# Patient Record
Sex: Female | Born: 1965 | Race: White | Hispanic: No | Marital: Married | Smoking: Never smoker
Health system: Southern US, Community
[De-identification: ages and names within clinical notes are randomized; demographics above are authoritative.]

## PROBLEM LIST (undated history)

## (undated) DIAGNOSIS — Z1371 Encounter for nonprocreative screening for genetic disease carrier status: Secondary | ICD-10-CM

## (undated) DIAGNOSIS — K219 Gastro-esophageal reflux disease without esophagitis: Secondary | ICD-10-CM

## (undated) DIAGNOSIS — G43909 Migraine, unspecified, not intractable, without status migrainosus: Secondary | ICD-10-CM

## (undated) DIAGNOSIS — N644 Mastodynia: Secondary | ICD-10-CM

## (undated) DIAGNOSIS — O009 Unspecified ectopic pregnancy without intrauterine pregnancy: Secondary | ICD-10-CM

## (undated) DIAGNOSIS — Z803 Family history of malignant neoplasm of breast: Secondary | ICD-10-CM

## (undated) DIAGNOSIS — M5030 Other cervical disc degeneration, unspecified cervical region: Secondary | ICD-10-CM

## (undated) DIAGNOSIS — N921 Excessive and frequent menstruation with irregular cycle: Secondary | ICD-10-CM

## (undated) HISTORY — DX: Gastro-esophageal reflux disease without esophagitis: K21.9

## (undated) HISTORY — DX: Unspecified ectopic pregnancy without intrauterine pregnancy: O00.90

## (undated) HISTORY — DX: Excessive and frequent menstruation with irregular cycle: N92.1

## (undated) HISTORY — DX: Mastodynia: N64.4

## (undated) HISTORY — DX: Migraine, unspecified, not intractable, without status migrainosus: G43.909

## (undated) HISTORY — DX: Encounter for nonprocreative screening for genetic disease carrier status: Z13.71

## (undated) HISTORY — DX: Other cervical disc degeneration, unspecified cervical region: M50.30

## (undated) HISTORY — DX: Family history of malignant neoplasm of breast: Z80.3

---

## 1994-12-06 HISTORY — PX: LEEP: SHX91

## 2006-12-06 HISTORY — PX: OTHER SURGICAL HISTORY: SHX169

## 2009-02-23 ENCOUNTER — Ambulatory Visit: Payer: Self-pay | Admitting: Internal Medicine

## 2012-08-31 HISTORY — PX: ENDOMETRIAL ABLATION: SHX621

## 2016-05-28 ENCOUNTER — Other Ambulatory Visit: Payer: Self-pay | Admitting: Obstetrics & Gynecology

## 2016-05-28 DIAGNOSIS — Z1231 Encounter for screening mammogram for malignant neoplasm of breast: Secondary | ICD-10-CM

## 2016-07-12 ENCOUNTER — Ambulatory Visit
Admission: RE | Admit: 2016-07-12 | Discharge: 2016-07-12 | Disposition: A | Discharge status: Home / Self Care | Payer: BC Managed Care – PPO | Source: Ambulatory Visit | Attending: Obstetrics & Gynecology | Admitting: Obstetrics & Gynecology

## 2016-07-12 ENCOUNTER — Other Ambulatory Visit: Payer: Self-pay | Admitting: Obstetrics & Gynecology

## 2016-07-12 DIAGNOSIS — Z1231 Encounter for screening mammogram for malignant neoplasm of breast: Secondary | ICD-10-CM

## 2016-09-05 DIAGNOSIS — Z1371 Encounter for nonprocreative screening for genetic disease carrier status: Secondary | ICD-10-CM

## 2016-09-05 DIAGNOSIS — Z9189 Other specified personal risk factors, not elsewhere classified: Secondary | ICD-10-CM

## 2016-09-05 HISTORY — DX: Other specified personal risk factors, not elsewhere classified: Z91.89

## 2016-09-05 HISTORY — DX: Encounter for nonprocreative screening for genetic disease carrier status: Z13.71

## 2017-01-05 ENCOUNTER — Other Ambulatory Visit: Payer: Self-pay | Admitting: Neurology

## 2017-01-05 DIAGNOSIS — G43711 Chronic migraine without aura, intractable, with status migrainosus: Secondary | ICD-10-CM

## 2017-01-12 DIAGNOSIS — G43711 Chronic migraine without aura, intractable, with status migrainosus: Secondary | ICD-10-CM | POA: Insufficient documentation

## 2017-01-13 ENCOUNTER — Ambulatory Visit
Admission: RE | Admit: 2017-01-13 | Discharge: 2017-01-13 | Disposition: A | Discharge status: Home / Self Care | Payer: BC Managed Care – PPO | Source: Ambulatory Visit | Attending: Neurology | Admitting: Neurology

## 2017-01-13 DIAGNOSIS — R202 Paresthesia of skin: Secondary | ICD-10-CM | POA: Diagnosis present

## 2017-01-13 DIAGNOSIS — G43711 Chronic migraine without aura, intractable, with status migrainosus: Secondary | ICD-10-CM | POA: Insufficient documentation

## 2017-01-13 MED ORDER — GADOBENATE DIMEGLUMINE 529 MG/ML IV SOLN
13.0000 mL | Freq: Once | INTRAVENOUS | Status: AC | PRN
  Administered 2017-01-13: 13 mL via INTRAVENOUS

## 2017-02-11 ENCOUNTER — Encounter: Payer: Self-pay | Admitting: Certified Nurse Midwife

## 2017-03-10 ENCOUNTER — Encounter: Payer: BC Managed Care – PPO | Admitting: Certified Nurse Midwife

## 2017-03-10 ENCOUNTER — Encounter: Payer: Self-pay | Admitting: Certified Nurse Midwife

## 2017-03-10 ENCOUNTER — Ambulatory Visit: Payer: BC Managed Care – PPO | Admitting: Certified Nurse Midwife

## 2017-03-10 VITALS — BP 108/68 | HR 80 | Ht 63.0 in | Wt 138.0 lb

## 2017-03-10 DIAGNOSIS — R202 Paresthesia of skin: Secondary | ICD-10-CM | POA: Diagnosis not present

## 2017-03-10 DIAGNOSIS — N951 Menopausal and female climacteric states: Secondary | ICD-10-CM | POA: Diagnosis not present

## 2017-03-10 DIAGNOSIS — Z803 Family history of malignant neoplasm of breast: Secondary | ICD-10-CM | POA: Diagnosis not present

## 2017-03-10 DIAGNOSIS — G43019 Migraine without aura, intractable, without status migrainosus: Secondary | ICD-10-CM | POA: Diagnosis not present

## 2017-03-10 DIAGNOSIS — R002 Palpitations: Secondary | ICD-10-CM

## 2017-07-18 ENCOUNTER — Ambulatory Visit: Payer: BC Managed Care – PPO | Admitting: Obstetrics & Gynecology

## 2017-08-06 ENCOUNTER — Encounter: Payer: Self-pay | Admitting: Certified Nurse Midwife

## 2017-08-06 DIAGNOSIS — Z803 Family history of malignant neoplasm of breast: Secondary | ICD-10-CM | POA: Insufficient documentation

## 2017-08-06 DIAGNOSIS — G43909 Migraine, unspecified, not intractable, without status migrainosus: Secondary | ICD-10-CM | POA: Insufficient documentation

## 2017-08-06 DIAGNOSIS — Z1371 Encounter for nonprocreative screening for genetic disease carrier status: Secondary | ICD-10-CM | POA: Insufficient documentation

## 2017-08-06 DIAGNOSIS — M5030 Other cervical disc degeneration, unspecified cervical region: Secondary | ICD-10-CM | POA: Insufficient documentation

## 2017-08-06 NOTE — Progress Notes (Signed)
Obstetrics & Gynecology Office Visit   Chief Complaint:  Chief Complaint  Patient presents with  . menopausal symptoms  . hormonal problems    History of Present Illness: 51 year old female who presents with several concerns that she thinks may be due to being perimenopausal. Her menses are infrequent and her LMP was 26 Oct. 2017. She has been having almost daily headaches on the crown of her head for the past 2 months.  Had a head MRI 01/2017 which was normal. Nortriptyline and Imitrex did not help. She has had no hot flashes, but some night sweats and some increased anxiety. Other recent symptoms have included tingling in her legs, breast tenderness, some palpitations, and fatigue. Magnesium supplements have helped to reduce palpitations. She is also taking omega 3, and vitamins D3, B12, and B6.   There is a family history of breast cancer in her maternal aunt, maternal cousin and MGM. She had a negative MYRISK test, but her lifetime risk of breast cancer is 23.2%. Her last annual exam was 51 Oct 2017 and her last mammogram 07/16/2017.    Review of Systems:  ROS   Past Medical History:  Past Medical History:  Diagnosis Date  . BRCA negative    MYRISK was negative/ lifetime risk of breast cancer of 23.2%  . Breast tenderness   . Degenerative disc disease, cervical   . Ectopic pregnancy    treated with methotrexate  . Menometrorrhagia   . Migraine     Past Surgical History:  Past Surgical History:  Procedure Laterality Date  . Bilateral tubal ligation  2008  . CESAREAN SECTION  2008   FITL  . ENDOMETRIAL ABLATION  08/31/2012   hysteroscopy/ D&C and Novasure  . LEEP  1996    Gynecologic History: Patient's last menstrual period was 09/26/2016.  Obstetric History:  OB History  Gravida Para Term Preterm AB Living  _0 SAB TAB Ectopic Multiple Live Births  _1 # Outcome Date GA Lbr Len/2nd Weight Sex Delivery Anes PTL Lv  6 Term 03/09/08   6 lb (2.722  kg) M CS-LTranv   LIV     Complications: Fetal Intolerance  5 Term 12/15/00   8 lb 2 oz (3.685 kg) F Vag-Spont   LIV  4 Term 04/10/98   7 lb 6 oz (3.345 kg) M Vag-Spont   LIV  3 Ectopic           2 SAB           1 SAB             Obstetric Comments  SAB in 2001 and 2004    Family History:  Family History  Problem Relation Age of Onset  . Breast cancer Maternal Aunt 89  . Breast cancer Maternal Grandmother   . Breast cancer Cousin        mat cousin (maternal aunt's daughter)  . Osteoporosis Mother   . Endometriosis Mother   . Kidney cancer Father     Social History:  Social History   Social History  . Marital status: Married    Spouse name: N/A  . Number of children: 3  . Years of education: N/A   Occupational History  . Not on file.   Social History Main Topics  . Smoking status: Never Smoker  . Smokeless tobacco: Never Used  . Alcohol use No  . Drug use:  No  . Sexual activity: Yes    Birth control/ protection: None   Other Topics Concern  . Not on file   Social History Narrative  . No narrative on file    Allergies:  No Known Allergies  Medications: Prior to Admission medications   Medication Sig Start Date End Date Taking? Authorizing Provider  Cholecalciferol (VITAMIN D3) 2000 units capsule Take by mouth.    [provider]  fluticasone Asencion Islam) 50 MCG/ACT nasal spray  02/14/17   [provider]  folic acid (FOLVITE) 1 MG tablet Take by mouth.    [provider]  Lactobacillus (PROBIOTIC ACIDOPHILUS) TABS Take by mouth.    [provider]  Magnesium Bisglycinate (MAG GLYCINATE PO) Take by mouth.    [provider]  Omega-3 Fatty Acids (FISH OIL PO) Take by mouth.    [provider]  pyridOXINE (VITAMIN B-6) 25 MG tablet Take by mouth.    [provider]  SUMAtriptan (IMITREX) 100 MG tablet Take by mouth. 01/04/17   [provider]  vitamin B-12 (CYANOCOBALAMIN) 1000 MCG tablet  Take by mouth.    [provider]  vitamin C (ASCORBIC ACID) 500 MG tablet Take by mouth.    [provider]    Physical Exam Vitals: BP 108/68   Pulse 80   Ht _0  (1.6 m)   Wt 138 lb (62.6 kg)   LMP 09/26/2016   BMI 24.45 kg/m  Physical Exam  Constitutional: She is oriented to person, place, and time. She appears well-developed and well-nourished. No distress.  Neurological: She is alert and oriented to person, place, and time.  Skin: Skin is warm and dry. No rash noted.  Psychiatric: She has a normal mood and affect. Her behavior is normal. Judgment and thought content normal.     Assessment/ Plan 51 y.o. [id number] perimenopausal female with some symptoms that are associated with the perimenopause, but others that are not. Discussed use of HRT to treat problems like vasomotor symptoms. DIscussed pros, cons and risks.  She is less likely to use HRT due to her increased risk of breast cancer. DIscussed other OTC herbal treatments for perimenopausal symptoms. She has a follow up appointment with her neurologist today. Recommend she talk to him first. If she desires to try HRT to see if it will relieve some of her symptoms, she is to call me or RTO to discuss further Her next annual is due in October.  Dalia Heading, CNM

## 2017-08-10 NOTE — Progress Notes (Signed)
This encounter was created in error - please disregard.

## 2017-08-18 ENCOUNTER — Encounter: Payer: Self-pay | Admitting: Advanced Practice Midwife

## 2017-08-18 ENCOUNTER — Ambulatory Visit (INDEPENDENT_AMBULATORY_CARE_PROVIDER_SITE_OTHER): Payer: BC Managed Care – PPO | Admitting: Advanced Practice Midwife

## 2017-08-18 ENCOUNTER — Ambulatory Visit: Payer: BC Managed Care – PPO | Admitting: Obstetrics & Gynecology

## 2017-08-18 VITALS — BP 110/70 | HR 66 | Ht 63.0 in | Wt 138.0 lb

## 2017-08-18 DIAGNOSIS — Z0000 Encounter for general adult medical examination without abnormal findings: Secondary | ICD-10-CM

## 2017-08-18 DIAGNOSIS — G609 Hereditary and idiopathic neuropathy, unspecified: Secondary | ICD-10-CM | POA: Insufficient documentation

## 2017-08-18 DIAGNOSIS — M25579 Pain in unspecified ankle and joints of unspecified foot: Secondary | ICD-10-CM | POA: Insufficient documentation

## 2017-08-18 DIAGNOSIS — Z01419 Encounter for gynecological examination (general) (routine) without abnormal findings: Secondary | ICD-10-CM

## 2017-08-18 DIAGNOSIS — Z124 Encounter for screening for malignant neoplasm of cervix: Secondary | ICD-10-CM

## 2017-08-18 LAB — HM PAP SMEAR: HM Pap smear: NORMAL

## 2017-08-18 NOTE — Addendum Note (Signed)
Addended by: Tresea MallGLEDHILL, Darcie Mellone on: 08/18/2017 11:31 AM   Modules accepted: Orders

## 2017-08-18 NOTE — Progress Notes (Signed)
Patient ID: Helen Turner, female   DOB: September 26, 1966, 51 y.o.   MRN: [id number]     Gynecology Annual Exam  PCP: Patient, No Pcp Per  Chief Complaint:  Chief Complaint  Patient presents with  . Gynecologic Exam    needs mammogram sched.    History of Present Illness:Patient is a 51 y.o. [id number] presents for annual exam. The patient has complaints today of hormonal changes. She has had irregular cycles for the past 9 months. She has history of uterine ablation 6 years ago but has had monthly periods that last for 5 days that consist of very light spotting up until last December when she did not have any bleeding until June of this year. Since June she has had a period monthly. Beginning in December she had a headache that lasted for 3 months and lower extremity tingling. She was seen by a neurologist, had thorough workup and found nothing neurologically wrong. It was suggested that her hormonal changes may somehow be responsible for her symptoms. She has some vasomotor symptoms. She denies vaginal dryness. Her main complaint now is acid reflux that she blames on anti inflammatory medications she has been taking. She takes protonix for that.    LMP: Patient's last menstrual period was 07/11/2017. Postcoital Bleeding: occasional Dysmenorrhea: no   The patient is sexually active. She denies dyspareunia.  The patient does perform self breast exams.  There is notable family history of breast or ovarian cancer in her family. Her maternal aunt was diagnosed with breast cancer younger than 54's. The patient has had genetic testing and was found to be BRCA negative.   The patient wears seatbelts: yes.   The patient has regular exercise: yes.  She admits to healthy diet and adequate hydration.  The patient denies current symptoms of depression.     Review of Systems: Review of Systems  Constitutional: Negative.   HENT: Negative.   Eyes: Negative.   Respiratory: Negative.   Cardiovascular:  Negative.   Gastrointestinal: Negative.   Genitourinary: Negative.   Musculoskeletal: Negative.   Skin: Negative.   Neurological: Positive for tingling.       Tingling in lower extremities has improved recently  Endo/Heme/Allergies: Negative.   Psychiatric/Behavioral: Negative.     Past Medical History:  Past Medical History:  Diagnosis Date  . Acid reflux   . BRCA negative    MYRISK was negative/ lifetime risk of breast cancer of 23.2%  . Breast tenderness   . Degenerative disc disease, cervical   . Ectopic pregnancy    treated with methotrexate  . Family history of breast cancer   . Menometrorrhagia   . Migraine     Past Surgical History:  Past Surgical History:  Procedure Laterality Date  . Bilateral tubal ligation  2008  . CESAREAN SECTION  2008   FITL  . ENDOMETRIAL ABLATION  08/31/2012   hysteroscopy/ D&C and Novasure  . LEEP  1996    Gynecologic History:  Patient's last menstrual period was 07/11/2017. Last Pap: 3 years ago Results were:  no abnormalities  Last mammogram: 1 year ago Results were: BI-RAD I Obstetric History: [id number]  Family History:  Family History  Problem Relation Age of Onset  . Breast cancer Maternal Aunt 89  . Breast cancer Maternal Grandmother   . Breast cancer Cousin        mat cousin (maternal aunt's daughter)  . Osteoporosis Mother   . Endometriosis Mother   . Kidney cancer Father  Social History:  Social History   Social History  . Marital status: Married    Spouse name: N/A  . Number of children: 3  . Years of education: N/A   Occupational History  . Not on file.   Social History Main Topics  . Smoking status: Never Smoker  . Smokeless tobacco: Never Used  . Alcohol use No  . Drug use: No  . Sexual activity: Yes    Birth control/ protection: None   Other Topics Concern  . Not on file   Social History Narrative  . No narrative on file    Allergies:  Allergies  Allergen Reactions  . Codeine Nausea  Only  . Amoxicillin-Pot Clavulanate Other (See Comments)    Nausea and causes worsening of symptoms in which she was treated for.   . Doxycycline Other (See Comments)    Nausea and causes worsening of symptoms in which she was treated for.   . Topiramate Other (See Comments)     Symptoms of fogginess    Medications: Prior to Admission medications   Medication Sig Start Date End Date Taking? Authorizing Provider  fluticasone (FLONASE) 50 MCG/ACT nasal spray  02/14/17  Yes [provider]  pantoprazole (PROTONIX) 40 MG tablet Take 40 mg by mouth daily. 08/02/17  Yes [provider]  SUMAtriptan (IMITREX) 100 MG tablet Take by mouth. 01/04/17  Yes [provider]  Cholecalciferol (VITAMIN D3) 2000 units capsule Take by mouth.    [provider]  folic acid (FOLVITE) 1 MG tablet Take by mouth.    [provider]  Lactobacillus (PROBIOTIC ACIDOPHILUS) TABS Take by mouth.    [provider]  Magnesium Bisglycinate (MAG GLYCINATE PO) Take by mouth.    [provider]  Omega-3 Fatty Acids (FISH OIL PO) Take by mouth.    [provider]  pyridOXINE (VITAMIN B-6) 25 MG tablet Take by mouth.    [provider]  vitamin B-12 (CYANOCOBALAMIN) 1000 MCG tablet Take by mouth.    [provider]  vitamin C (ASCORBIC ACID) 500 MG tablet Take by mouth.    [provider]    Physical Exam Vitals: Blood pressure 110/70, pulse 66, height '5\' 3"'  (1.6 m), weight 138 lb (62.6 kg), last menstrual period 07/11/2017.  General: NAD HEENT: normocephalic, anicteric Thyroid: no enlargement, no palpable nodules Pulmonary: No increased work of breathing, CTAB Cardiovascular: RRR, distal pulses 2+ Breast: Breast symmetrical, no tenderness, no palpable nodules or masses, no skin or nipple retraction present, no nipple discharge.  No axillary or supraclavicular lymphadenopathy. Abdomen: NABS, soft, non-tender, non-distended.   Umbilicus without lesions.  No hepatomegaly, splenomegaly or masses palpable. No evidence of hernia  Genitourinary:  External: Normal external female genitalia.  Normal urethral meatus, normal  Bartholin's and Skene's glands.    Vagina: Normal vaginal mucosa, no evidence of prolapse.    Cervix: Grossly normal in appearance, no bleeding, no CMT  Uterus: Non-enlarged, mobile, normal contour.    Adnexa: ovaries non-enlarged, no adnexal masses  Rectal: deferred  Lymphatic: no evidence of inguinal lymphadenopathy Extremities: no edema, erythema, or tenderness Neurologic: Grossly intact Psychiatric: mood appropriate, affect full   Assessment: 51 y.o. [id number] routine annual exam  Plan: Problem List Items Addressed This Visit    None    Visit Diagnoses    Blood tests for routine general physical examination    -  Primary   Relevant Orders   Lipid Panel With LDL/HDL Ratio  1) Mammogram - recommend yearly screening mammogram.  Mammogram Was ordered today  2) Continue healthy lifestyle diet and exercise  3) ASCCP guidelines and rational discussed.  Patient opts for every 3 years screening interval  4) Osteoporosis  - per USPTF routine screening DEXA at age 49  Consider FDA-approved medical therapies in postmenopausal women and men aged 55 years and older, based on the following: a) A hip or vertebral (clinical or morphometric) fracture b) T-score ? -2.5 at the femoral neck or spine after appropriate evaluation to exclude secondary causes C) Low bone mass (T-score between -1.0 and -2.5 at the femoral neck or spine) and a 10-year probability of a hip fracture ? 3% or a 10-year probability of a major osteoporosis-related fracture ? 20% based on the [place]-adapted WHO algorithm   5) Routine healthcare maintenance including cholesterol, diabetes screening discussed Ordered today  6) Colonoscopy ordered by PCP.  Screening recommended starting at age 1 for average risk individuals, age 78  for individuals deemed at increased risk (including African Americans) and recommended to continue until age 68.  For patient age 69-85 individualized approach is recommended.  Gold standard screening is via colonoscopy, Cologuard screening is an acceptable alternative for patient unwilling or unable to undergo colonoscopy.  "Colorectal cancer screening for average?risk adults: 2018 guideline update from the American Cancer Society"CA: A Cancer Journal for Clinicians: May 04, 2017   7) Follow up 1 year for routine annual  Rod Can, [place]

## 2017-08-19 LAB — LIPID PANEL WITH LDL/HDL RATIO
Cholesterol, Total: 188 mg/dL (ref 100–199)
HDL: 66 mg/dL (ref >39)
LDL Calculated: 104 mg/dL — ABNORMAL HIGH (ref 0–99)
LDl/HDL Ratio: 1.6 ratio (ref 0.0–3.2)
TRIGLYCERIDES: 89 mg/dL (ref 0–149)
VLDL Cholesterol Cal: 18 mg/dL (ref 5–40)

## 2017-08-23 LAB — IGP, APTIMA HPV
HPV Aptima: NEGATIVE
PAP SMEAR COMMENT: 0

## 2017-09-19 ENCOUNTER — Ambulatory Visit
Admission: RE | Admit: 2017-09-19 | Discharge: 2017-09-19 | Disposition: A | Discharge status: Home / Self Care | Payer: BC Managed Care – PPO | Source: Ambulatory Visit | Attending: Advanced Practice Midwife | Admitting: Advanced Practice Midwife

## 2017-09-19 DIAGNOSIS — Z1231 Encounter for screening mammogram for malignant neoplasm of breast: Secondary | ICD-10-CM | POA: Insufficient documentation

## 2017-09-19 DIAGNOSIS — N6489 Other specified disorders of breast: Secondary | ICD-10-CM | POA: Insufficient documentation

## 2017-09-19 DIAGNOSIS — R928 Other abnormal and inconclusive findings on diagnostic imaging of breast: Secondary | ICD-10-CM | POA: Insufficient documentation

## 2017-09-19 DIAGNOSIS — Z01419 Encounter for gynecological examination (general) (routine) without abnormal findings: Secondary | ICD-10-CM

## 2017-09-20 ENCOUNTER — Other Ambulatory Visit: Payer: Self-pay | Admitting: Advanced Practice Midwife

## 2017-09-20 DIAGNOSIS — R928 Other abnormal and inconclusive findings on diagnostic imaging of breast: Secondary | ICD-10-CM

## 2017-09-20 DIAGNOSIS — N6489 Other specified disorders of breast: Secondary | ICD-10-CM

## 2017-09-22 ENCOUNTER — Other Ambulatory Visit: Payer: Self-pay | Admitting: Advanced Practice Midwife

## 2017-09-22 DIAGNOSIS — N6489 Other specified disorders of breast: Secondary | ICD-10-CM

## 2017-09-26 ENCOUNTER — Ambulatory Visit
Admission: RE | Admit: 2017-09-26 | Discharge: 2017-09-26 | Disposition: A | Discharge status: Home / Self Care | Payer: BC Managed Care – PPO | Source: Ambulatory Visit | Attending: Advanced Practice Midwife | Admitting: Advanced Practice Midwife

## 2017-09-26 DIAGNOSIS — N6489 Other specified disorders of breast: Secondary | ICD-10-CM

## 2017-09-29 ENCOUNTER — Ambulatory Visit: Payer: BC Managed Care – PPO

## 2017-09-29 ENCOUNTER — Other Ambulatory Visit: Payer: BC Managed Care – PPO

## 2018-08-24 ENCOUNTER — Other Ambulatory Visit: Payer: Self-pay | Admitting: Obstetrics and Gynecology

## 2018-08-24 DIAGNOSIS — Z1231 Encounter for screening mammogram for malignant neoplasm of breast: Secondary | ICD-10-CM

## 2018-08-25 ENCOUNTER — Ambulatory Visit: Payer: BC Managed Care – PPO | Admitting: Advanced Practice Midwife

## 2018-08-28 ENCOUNTER — Encounter: Payer: Self-pay | Admitting: Advanced Practice Midwife

## 2018-08-28 ENCOUNTER — Ambulatory Visit (INDEPENDENT_AMBULATORY_CARE_PROVIDER_SITE_OTHER): Payer: BC Managed Care – PPO | Admitting: Advanced Practice Midwife

## 2018-08-28 VITALS — BP 118/80 | Ht 63.0 in | Wt 136.0 lb

## 2018-08-28 DIAGNOSIS — Z01411 Encounter for gynecological examination (general) (routine) with abnormal findings: Secondary | ICD-10-CM | POA: Diagnosis not present

## 2018-08-28 DIAGNOSIS — Z0000 Encounter for general adult medical examination without abnormal findings: Secondary | ICD-10-CM

## 2018-08-28 DIAGNOSIS — K219 Gastro-esophageal reflux disease without esophagitis: Secondary | ICD-10-CM

## 2018-08-28 NOTE — Patient Instructions (Signed)
Preventive Care 40-64 Years, Female Preventive care refers to lifestyle choices and visits with your health care provider that can promote health and wellness. What does preventive care include?  A yearly physical exam. This is also called an annual well check.  Dental exams once or twice a year.  Routine eye exams. Ask your health care provider how often you should have your eyes checked.  Personal lifestyle choices, including: ? Daily care of your teeth and gums. ? Regular physical activity. ? Eating a healthy diet. ? Avoiding tobacco and drug use. ? Limiting alcohol use. ? Practicing safe sex. ? Taking low-dose aspirin daily starting at age 58. ? Taking vitamin and mineral supplements as recommended by your health care provider. What happens during an annual well check? The services and screenings done by your health care provider during your annual well check will depend on your age, overall health, lifestyle risk factors, and family history of disease. Counseling Your health care provider may ask you questions about your:  Alcohol use.  Tobacco use.  Drug use.  Emotional well-being.  Home and relationship well-being.  Sexual activity.  Eating habits.  Work and work Statistician.  Method of birth control.  Menstrual cycle.  Pregnancy history.  Screening You may have the following tests or measurements:  Height, weight, and BMI.  Blood pressure.  Lipid and cholesterol levels. These may be checked every 5 years, or more frequently if you are over 81 years old.  Skin check.  Lung cancer screening. You may have this screening every year starting at age 78 if you have a 30-pack-year history of smoking and currently smoke or have quit within the past 15 years.  Fecal occult blood test (FOBT) of the stool. You may have this test every year starting at age 65.  Flexible sigmoidoscopy or colonoscopy. You may have a sigmoidoscopy every 5 years or a colonoscopy  every 10 years starting at age 30.  Hepatitis C blood test.  Hepatitis B blood test.  Sexually transmitted disease (STD) testing.  Diabetes screening. This is done by checking your blood sugar (glucose) after you have not eaten for a while (fasting). You may have this done every 1-3 years.  Mammogram. This may be done every 1-2 years. Talk to your health care provider about when you should start having regular mammograms. This may depend on whether you have a family history of breast cancer.  BRCA-related cancer screening. This may be done if you have a family history of breast, ovarian, tubal, or peritoneal cancers.  Pelvic exam and Pap test. This may be done every 3 years starting at age 80. Starting at age 36, this may be done every 5 years if you have a Pap test in combination with an HPV test.  Bone density scan. This is done to screen for osteoporosis. You may have this scan if you are at high risk for osteoporosis.  Discuss your test results, treatment options, and if necessary, the need for more tests with your health care provider. Vaccines Your health care provider may recommend certain vaccines, such as:  Influenza vaccine. This is recommended every year.  Tetanus, diphtheria, and acellular pertussis (Tdap, Td) vaccine. You may need a Td booster every 10 years.  Varicella vaccine. You may need this if you have not been vaccinated.  Zoster vaccine. You may need this after age 5.  Measles, mumps, and rubella (MMR) vaccine. You may need at least one dose of MMR if you were born in  1957 or later. You may also need a second dose.  Pneumococcal 13-valent conjugate (PCV13) vaccine. You may need this if you have certain conditions and were not previously vaccinated.  Pneumococcal polysaccharide (PPSV23) vaccine. You may need one or two doses if you smoke cigarettes or if you have certain conditions.  Meningococcal vaccine. You may need this if you have certain  conditions.  Hepatitis A vaccine. You may need this if you have certain conditions or if you travel or work in places where you may be exposed to hepatitis A.  Hepatitis B vaccine. You may need this if you have certain conditions or if you travel or work in places where you may be exposed to hepatitis B.  Haemophilus influenzae type b (Hib) vaccine. You may need this if you have certain conditions.  Talk to your health care provider about which screenings and vaccines you need and how often you need them. This information is not intended to replace advice given to you by your health care provider. Make sure you discuss any questions you have with your health care provider. Document Released: 12/19/2015 Document Revised: 08/11/2016 Document Reviewed: 09/23/2015 Elsevier Interactive Patient Education  2018 [employer]  

## 2018-08-28 NOTE — Progress Notes (Signed)
Patient ID: Helen Turner, female   DOB: [birth year], 52 y.o.   MRN: [id number]      Gynecology Annual Exam  PCP: Sallee Lange, NP  Chief Complaint:  Chief Complaint  Patient presents with  . Gynecologic Exam    History of Present Illness:Patient is a 52 y.o. [id number] presents for annual exam. The patient has complaint today of ongoing acid reflux which is causing her throat to be sore and is changing her voice. She has seen one GI doctor and had minimal relief. She plans to follow up with a different GI doctor. She has also seen a provider who specializes in menopausal changes. Her hormone levels at that time were perimenopausal. She has been using Estradiol patch off and on for the past couple months to see if that will help the lower leg tingling and numbness that she has. She has not noticed symptom relief yet. She is worried about the potential to develop cancer from hormone replacement. She has follow up with that provider as well. She has ongoing anxiety that she manages without medication. She has no Gyn complaints today.    LMP: No LMP recorded. (Menstrual status: Perimenopausal). Menarche:not applicable She has had 1 drop of blood in April and 1 drop in June and otherwise no bleeding in the past year Postcoital Bleeding: no Dysmenorrhea: not applicable  The patient is sexually active. She denies dyspareunia.  The patient does perform self breast exams.  There is notable family history of breast or ovarian cancer in her family. She has had genetic testing and is negative for BRCA.   The patient wears seatbelts: yes.   The patient has regular exercise: yes.  She admits healthy lifestyle diet, hydration, exercise, sleep.   The patient denies current symptoms of depression.     Review of Systems: Review of Systems  Constitutional: Negative.   HENT:       Sore throat, acid reflux  Eyes: Negative.   Respiratory: Negative.   Cardiovascular: Negative.     Gastrointestinal:       Occasional constipation  Genitourinary: Negative.   Musculoskeletal: Negative.   Skin: Negative.   Neurological:       Tingling/numbness lower legs  Psychiatric/Behavioral:       Anxiety    Past Medical History:  Past Medical History:  Diagnosis Date  . Acid reflux   . BRCA negative    MYRISK was negative/ lifetime risk of breast cancer of 23.2%  . Breast tenderness   . Degenerative disc disease, cervical   . Ectopic pregnancy    treated with methotrexate  . Family history of breast cancer   . Menometrorrhagia   . Migraine     Past Surgical History:  Past Surgical History:  Procedure Laterality Date  . Bilateral tubal ligation  2008  . CESAREAN SECTION  2008   FITL  . ENDOMETRIAL ABLATION  08/31/2012   hysteroscopy/ D&C and Novasure  . LEEP  1996    Gynecologic History:  No LMP recorded. (Menstrual status: Perimenopausal). Last Pap: 1 year ago Results were:  no abnormalities  Last mammogram: 1 year ago Results were: BI-RAD II  Obstetric History: [id number]  Family History:  Family History  Problem Relation Age of Onset  . Breast cancer Maternal Aunt 89  . Breast cancer Maternal Grandmother   . Breast cancer Cousin        mat cousin (maternal aunt's daughter)  . Osteoporosis Mother   . Endometriosis Mother   .  Kidney cancer Father   . BRCA 1/2 Neg Hx     Social History:  Social History   Socioeconomic History  . Marital status: Married    Spouse name: Not on file  . Number of children: 3  . Years of education: Not on file  . Highest education level: Not on file  Occupational History  . Not on file  Social Needs  . Financial resource strain: Not on file  . Food insecurity:    Worry: Not on file    Inability: Not on file  . Transportation needs:    Medical: Not on file    Non-medical: Not on file  Tobacco Use  . Smoking status: Never Smoker  . Smokeless tobacco: Never Used  Substance and Sexual Activity  . Alcohol  use: No  . Drug use: No  . Sexual activity: Yes    Birth control/protection: None  Lifestyle  . Physical activity:    Days per week: Not on file    Minutes per session: Not on file  . Stress: Not on file  Relationships  . Social connections:    Talks on phone: Not on file    Gets together: Not on file    Attends religious service: Not on file    Active member of club or organization: Not on file    Attends meetings of clubs or organizations: Not on file    Relationship status: Not on file  . Intimate partner violence:    Fear of current or ex partner: Not on file    Emotionally abused: Not on file    Physically abused: Not on file    Forced sexual activity: Not on file  Other Topics Concern  . Not on file  Social History Narrative  . Not on file    Allergies:  Allergies  Allergen Reactions  . Codeine Nausea Only  . Amoxicillin-Pot Clavulanate Other (See Comments)    Nausea and causes worsening of symptoms in which she was treated for.   . Doxycycline Other (See Comments)    Nausea and causes worsening of symptoms in which she was treated for.   . Topiramate Other (See Comments)     Symptoms of fogginess    Medications: Prior to Admission medications   Medication Sig Start Date End Date Taking? Authorizing Provider  Cholecalciferol (VITAMIN D3) 2000 units capsule Take by mouth.   Yes [provider]  Dexlansoprazole (DEXILANT) 30 MG capsule Dexilant 30 mg capsule, delayed release 03/20/18  Yes [provider]  fluticasone (FLONASE) 50 MCG/ACT nasal spray  02/14/17  Yes [provider]  folic acid (FOLVITE) 1 MG tablet Take by mouth.   Yes [provider]  Lactobacillus (PROBIOTIC ACIDOPHILUS) TABS Take by mouth.   Yes [provider]  Magnesium Bisglycinate (MAG GLYCINATE PO) Take by mouth.   Yes [provider]  Omega-3 Fatty Acids (FISH OIL PO) Take by mouth.   Yes [provider]  pantoprazole (PROTONIX)  40 MG tablet Take 40 mg by mouth daily. 08/02/17  Yes [provider]  pyridOXINE (VITAMIN B-6) 25 MG tablet Take by mouth.   Yes [provider]  SUMAtriptan (IMITREX) 100 MG tablet Take by mouth. 01/04/17  Yes [provider]  vitamin B-12 (CYANOCOBALAMIN) 1000 MCG tablet Take by mouth.   Yes [provider]  vitamin C (ASCORBIC ACID) 500 MG tablet Take by mouth.   Yes [provider]  estradiol (VIVELLE-DOT) 0.05 MG/24HR patch  08/20/18  [provider]    Physical Exam Vitals: Blood pressure 118/80, height _0  (1.6 m), weight 136 lb (61.7 kg).  General: NAD HEENT: normocephalic, anicteric Thyroid: no enlargement, no palpable nodules Pulmonary: No increased work of breathing, CTAB Cardiovascular: RRR, distal pulses 2+ Breast: Breast symmetrical, no tenderness, no palpable nodules or masses, no skin or nipple retraction present, no nipple discharge.  No axillary or supraclavicular lymphadenopathy. Abdomen: NABS, soft, non-tender, non-distended.  Umbilicus without lesions.  No hepatomegaly, splenomegaly or masses palpable. No evidence of hernia  Genitourinary: deferred for no concerns/PAP interval/shared decision making Extremities: no edema, erythema, or tenderness Neurologic: Grossly intact Psychiatric: mood appropriate, affect full     Assessment: 52 y.o. [id number] routine annual exam  Plan: Problem List Items Addressed This Visit    None    Visit Diagnoses    Well woman exam without gynecological exam    -  Primary   Blood tests for routine general physical examination       Relevant Orders   Vitamin B12   VITAMIN D 25 Hydroxy (Vit-D Deficiency, Fractures)      1) Mammogram - recommend yearly screening mammogram.  Mammogram is scheduled for next month  2) STI screening  was offered and declined  3) ASCCP guidelines and rational discussed.  Patient opts for every 3 years screening interval  4) Osteoporosis  - per  USPTF routine screening DEXA at age 55   Consider FDA-approved medical therapies in postmenopausal women and men aged 29 years and older, based on the following: a) A hip or vertebral (clinical or morphometric) fracture b) T-score ? -2.5 at the femoral neck or spine after appropriate evaluation to exclude secondary causes C) Low bone mass (T-score between -1.0 and -2.5 at the femoral neck or spine) and a 10-year probability of a hip fracture ? 3% or a 10-year probability of a major osteoporosis-related fracture ? 20% based on the [place]-adapted WHO algorithm   5) Routine healthcare maintenance including cholesterol, diabetes screening discussed Ordered today  6) Colonoscopy was 6 months ago.  Screening recommended starting at age 47 for average risk individuals, age 31 for individuals deemed at increased risk (including African Americans) and recommended to continue until age 29.  For patient age 50-85 individualized approach is recommended.  Gold standard screening is via colonoscopy, Cologuard screening is an acceptable alternative for patient unwilling or unable to undergo colonoscopy.  "Colorectal cancer screening for average?risk adults: 2018 guideline update from the American Cancer Society"CA: A Cancer Journal for Clinicians: May 04, 2017   7) Return in about 1 year (around 08/29/2019) for annual established gyn.   Rod Can, [place], [facility] 08/28/2018, 1:39 PM

## 2018-08-29 LAB — VITAMIN D 25 HYDROXY (VIT D DEFICIENCY, FRACTURES): Vit D, 25-Hydroxy: 37.7 ng/mL (ref 30.0–100.0)

## 2018-08-29 LAB — VITAMIN B12: VITAMIN B 12: 398 pg/mL (ref 232–1245)

## 2018-09-25 ENCOUNTER — Ambulatory Visit
Admission: RE | Admit: 2018-09-25 | Discharge: 2018-09-25 | Disposition: A | Discharge status: Home / Self Care | Payer: BC Managed Care – PPO | Source: Ambulatory Visit | Attending: Obstetrics and Gynecology | Admitting: Obstetrics and Gynecology

## 2018-09-25 DIAGNOSIS — Z1231 Encounter for screening mammogram for malignant neoplasm of breast: Secondary | ICD-10-CM

## 2018-09-26 ENCOUNTER — Encounter: Payer: Self-pay | Admitting: Obstetrics and Gynecology

## 2018-12-20 ENCOUNTER — Telehealth: Payer: Self-pay

## 2018-12-20 NOTE — Telephone Encounter (Signed)
[facility] calling for additional dx codes.  Adv what they have is all that is in chart.

## 2019-09-04 ENCOUNTER — Other Ambulatory Visit: Payer: Self-pay | Admitting: Obstetrics and Gynecology

## 2019-09-04 DIAGNOSIS — Z1231 Encounter for screening mammogram for malignant neoplasm of breast: Secondary | ICD-10-CM

## 2019-09-05 ENCOUNTER — Other Ambulatory Visit: Payer: Self-pay

## 2019-09-05 ENCOUNTER — Ambulatory Visit: Payer: BC Managed Care – PPO | Admitting: Obstetrics and Gynecology

## 2019-09-05 ENCOUNTER — Encounter: Payer: Self-pay | Admitting: Advanced Practice Midwife

## 2019-09-05 ENCOUNTER — Ambulatory Visit (INDEPENDENT_AMBULATORY_CARE_PROVIDER_SITE_OTHER): Payer: BC Managed Care – PPO | Admitting: Advanced Practice Midwife

## 2019-09-05 VITALS — BP 122/70 | Ht 63.0 in | Wt 138.0 lb

## 2019-09-05 DIAGNOSIS — Z01419 Encounter for gynecological examination (general) (routine) without abnormal findings: Secondary | ICD-10-CM | POA: Diagnosis not present

## 2019-09-05 DIAGNOSIS — Z0000 Encounter for general adult medical examination without abnormal findings: Secondary | ICD-10-CM

## 2019-09-05 NOTE — Patient Instructions (Signed)
Health Maintenance, Female Adopting a healthy lifestyle and getting preventive care are important in promoting health and wellness. Ask your health care provider about:  The right schedule for you to have regular tests and exams.  Things you can do on your own to prevent diseases and keep yourself healthy. What should I know about diet, weight, and exercise? Eat a healthy diet   Eat a diet that includes plenty of vegetables, fruits, low-fat dairy products, and lean protein.  Do not eat a lot of foods that are high in solid fats, added sugars, or sodium. Maintain a healthy weight Body mass index (BMI) is used to identify weight problems. It estimates body fat based on height and weight. Your health care provider can help determine your BMI and help you achieve or maintain a healthy weight. Get regular exercise Get regular exercise. This is one of the most important things you can do for your health. Most adults should:  Exercise for at least 150 minutes each week. The exercise should increase your heart rate and make you sweat (moderate-intensity exercise).  Do strengthening exercises at least twice a week. This is in addition to the moderate-intensity exercise.  Spend less time sitting. Even light physical activity can be beneficial. Watch cholesterol and blood lipids Have your blood tested for lipids and cholesterol at 52 years of age, then have this test every 5 years. Have your cholesterol levels checked more often if:  Your lipid or cholesterol levels are high.  You are older than 52 years of age.  You are at high risk for heart disease. What should I know about cancer screening? Depending on your health history and family history, you may need to have cancer screening at various ages. This may include screening for:  Breast cancer.  Cervical cancer.  Colorectal cancer.  Skin cancer.  Lung cancer. What should I know about heart disease, diabetes, and high blood  pressure? Blood pressure and heart disease  High blood pressure causes heart disease and increases the risk of stroke. This is more likely to develop in people who have high blood pressure readings, are of African descent, or are overweight.  Have your blood pressure checked: ? Every 3-5 years if you are 18-39 years of age. ? Every year if you are 40 years old or older. Diabetes Have regular diabetes screenings. This checks your fasting blood sugar level. Have the screening done:  Once every three years after age 40 if you are at a normal weight and have a low risk for diabetes.  More often and at a younger age if you are overweight or have a high risk for diabetes. What should I know about preventing infection? Hepatitis B If you have a higher risk for hepatitis B, you should be screened for this virus. Talk with your health care provider to find out if you are at risk for hepatitis B infection. Hepatitis C Testing is recommended for:  Everyone born from 1945 through 1965.  Anyone with known risk factors for hepatitis C. Sexually transmitted infections (STIs)  Get screened for STIs, including gonorrhea and chlamydia, if: ? You are sexually active and are younger than 52 years of age. ? You are older than 52 years of age and your health care provider tells you that you are at risk for this type of infection. ? Your sexual activity has changed since you were last screened, and you are at increased risk for chlamydia or gonorrhea. Ask your health care provider if   you are at risk.  Ask your health care provider about whether you are at high risk for HIV. Your health care provider may recommend a prescription medicine to help prevent HIV infection. If you choose to take medicine to prevent HIV, you should first get tested for HIV. You should then be tested every 3 months for as long as you are taking the medicine. Pregnancy  If you are about to stop having your period (premenopausal) and  you may become pregnant, seek counseling before you get pregnant.  Take 400 to 800 micrograms (mcg) of folic acid every day if you become pregnant.  Ask for birth control (contraception) if you want to prevent pregnancy. Osteoporosis and menopause Osteoporosis is a disease in which the bones lose minerals and strength with aging. This can result in bone fractures. If you are 65 years old or older, or if you are at risk for osteoporosis and fractures, ask your health care provider if you should:  Be screened for bone loss.  Take a calcium or vitamin D supplement to lower your risk of fractures.  Be given hormone replacement therapy (HRT) to treat symptoms of menopause. Follow these instructions at home: Lifestyle  Do not use any products that contain nicotine or tobacco, such as cigarettes, e-cigarettes, and chewing tobacco. If you need help quitting, ask your health care provider.  Do not use street drugs.  Do not share needles.  Ask your health care provider for help if you need support or information about quitting drugs. Alcohol use  Do not drink alcohol if: ? Your health care provider tells you not to drink. ? You are pregnant, may be pregnant, or are planning to become pregnant.  If you drink alcohol: ? Limit how much you use to 0-1 drink a day. ? Limit intake if you are breastfeeding.  Be aware of how much alcohol is in your drink. In the [place] one drink equals one 12 oz bottle of beer (355 mL), one 5 oz glass of wine (148 mL), or one 1 oz glass of hard liquor (44 mL). General instructions  Schedule regular health, dental, and eye exams.  Stay current with your vaccines.  Tell your health care provider if: ? You often feel depressed. ? You have ever been abused or do not feel safe at home. Summary  Adopting a healthy lifestyle and getting preventive care are important in promoting health and wellness.  Follow your health care provider's instructions about healthy  diet, exercising, and getting tested or screened for diseases.  Follow your health care provider's instructions on monitoring your cholesterol and blood pressure. This information is not intended to replace advice given to you by your health care provider. Make sure you discuss any questions you have with your health care provider. Document Released: 06/07/2011 Document Revised: 11/15/2018 Document Reviewed: 11/15/2018 Elsevier Patient Education  2020 Elsevier Inc.  

## 2019-09-06 LAB — CBC WITH DIFFERENTIAL/PLATELET
Basophils Absolute: 0.1 10*3/uL (ref 0.0–0.2)
Basos: 1 %
EOS (ABSOLUTE): 0.1 10*3/uL (ref 0.0–0.4)
Eos: 1 %
Hematocrit: 39.9 % (ref 34.0–46.6)
Hemoglobin: 13.4 g/dL (ref 11.1–15.9)
Immature Grans (Abs): 0 10*3/uL (ref 0.0–0.1)
Immature Granulocytes: 0 %
Lymphocytes Absolute: 1.9 10*3/uL (ref 0.7–3.1)
Lymphs: 26 %
MCH: 30.3 pg (ref 26.6–33.0)
MCHC: 33.6 g/dL (ref 31.5–35.7)
MCV: 90 fL (ref 79–97)
Monocytes Absolute: 0.6 10*3/uL (ref 0.1–0.9)
Monocytes: 8 %
Neutrophils Absolute: 4.8 10*3/uL (ref 1.4–7.0)
Neutrophils: 64 %
Platelets: 145 10*3/uL — ABNORMAL LOW (ref 150–450)
RBC: 4.42 x10E6/uL (ref 3.77–5.28)
RDW: 11.8 % (ref 11.7–15.4)
WBC: 7.4 10*3/uL (ref 3.4–10.8)

## 2019-09-06 LAB — LIPID PANEL WITH LDL/HDL RATIO
Cholesterol, Total: 204 mg/dL — ABNORMAL HIGH (ref 100–199)
HDL: 76 mg/dL (ref >39)
LDL Chol Calc (NIH): 115 mg/dL — ABNORMAL HIGH (ref 0–99)
LDL/HDL Ratio: 1.5 ratio (ref 0.0–3.2)
Triglycerides: 75 mg/dL (ref 0–149)
VLDL Cholesterol Cal: 13 mg/dL (ref 5–40)

## 2019-09-06 LAB — VITAMIN D 25 HYDROXY (VIT D DEFICIENCY, FRACTURES): Vit D, 25-Hydroxy: 32.2 ng/mL (ref 30.0–100.0)

## 2019-09-06 LAB — HGB A1C W/O EAG: Hgb A1c MFr Bld: 5 % (ref 4.8–5.6)

## 2019-09-06 NOTE — Progress Notes (Signed)
Gynecology Annual Exam  PCP: Sallee Lange, NP  Chief Complaint:  Chief Complaint  Patient presents with  . Annual Exam    History of Present Illness:Patient is a 53 y.o. [id number] presents for annual exam. The patient has no gyn complaints today. She notes some improvement with acid reflux. She admits her anxiety is primarily related to medical issues (the potential of medical issues) but is still able to cope without medication.    LMP: No LMP recorded. (Menstrual status: postmenopausal). Her last period was more than 1 year ago Menarche:not applicable   The patient is sexually active. She denies dyspareunia. She has occasional hot flashes. The patient does perform self breast exams.  There is notable family history of breast or ovarian cancer in her family. She is BRCA negative.  The patient wears seatbelts: yes.   The patient has regular exercise: She walks daily. She admits healthy lifestyle diet, hydration, sleep.    The patient denies current symptoms of depression.     Review of Systems: Review of Systems  Constitutional: Negative.   HENT: Negative.   Eyes: Negative.   Respiratory: Negative.   Cardiovascular: Negative.   Gastrointestinal: Negative.   Genitourinary: Negative.   Musculoskeletal: Negative.   Skin: Negative.   Neurological: Positive for tingling and headaches.  Endo/Heme/Allergies: Negative.   Psychiatric/Behavioral:       Anxiety    Past Medical History:  Past Medical History:  Diagnosis Date  . Acid reflux   . BRCA negative    MYRISK was negative/ lifetime risk of breast cancer of 23.2%  . Breast tenderness   . Degenerative disc disease, cervical   . Ectopic pregnancy    treated with methotrexate  . Family history of breast cancer   . Menometrorrhagia   . Migraine     Past Surgical History:  Past Surgical History:  Procedure Laterality Date  . Bilateral tubal ligation  2008  . CESAREAN SECTION  2008   FITL  . ENDOMETRIAL  ABLATION  08/31/2012   hysteroscopy/ D&C and Novasure  . LEEP  1996    Gynecologic History:  No LMP recorded. (Menstrual status: Perimenopausal). Last Pap: 2 years ago Results were:  no abnormalities  Last mammogram: 1 year ago Results were: BI-RAD I  Obstetric History: [id number]  Family History:  Family History  Problem Relation Age of Onset  . Breast cancer Maternal Aunt 89  . Breast cancer Maternal Grandmother   . Breast cancer Cousin        mat cousin (maternal aunt's daughter)  . Osteoporosis Mother   . Endometriosis Mother   . Kidney cancer Father   . BRCA 1/2 Neg Hx     Social History:  Social History   Socioeconomic History  . Marital status: Married    Spouse name: Not on file  . Number of children: 3  . Years of education: Not on file  . Highest education level: Not on file  Occupational History  . Not on file  Social Needs  . Financial resource strain: Not on file  . Food insecurity    Worry: Not on file    Inability: Not on file  . Transportation needs    Medical: Not on file    Non-medical: Not on file  Tobacco Use  . Smoking status: Never Smoker  . Smokeless tobacco: Never Used  Substance and Sexual Activity  . Alcohol use: No  . Drug use: No  . Sexual activity: Yes  Birth control/protection: None  Lifestyle  . Physical activity    Days per week: Not on file    Minutes per session: Not on file  . Stress: Not on file  Relationships  . Social Herbalist on phone: Not on file    Gets together: Not on file    Attends religious service: Not on file    Active member of club or organization: Not on file    Attends meetings of clubs or organizations: Not on file    Relationship status: Not on file  . Intimate partner violence    Fear of current or ex partner: Not on file    Emotionally abused: Not on file    Physically abused: Not on file    Forced sexual activity: Not on file  Other Topics Concern  . Not on file  Social History  Narrative  . Not on file    Allergies:  Allergies  Allergen Reactions  . Codeine Nausea Only  . Amoxicillin-Pot Clavulanate Other (See Comments)    Nausea and causes worsening of symptoms in which she was treated for.   . Doxycycline Other (See Comments)    Nausea and causes worsening of symptoms in which she was treated for.   . Topiramate Other (See Comments)     Symptoms of fogginess    Medications: Prior to Admission medications   Medication Sig Start Date End Date Taking? Authorizing Provider  Cholecalciferol (VITAMIN D3) 2000 units capsule Take by mouth.   Yes [provider]  omeprazole (PRILOSEC) 10 MG capsule Take 10 mg by mouth daily.   Yes [provider]  esomeprazole (NEXIUM) 40 MG capsule Take 40 mg by mouth 2 (two) times daily. 05/31/19   [provider]  triamcinolone cream (KENALOG) 0.1 % 1(ONE) APPLICATION(S) TOPICAL 2(TWO) TIMES A DAY 03/27/19   [provider]    Physical Exam Vitals: Blood pressure 122/70, height '5\' 3"'  (1.6 m), weight 138 lb (62.6 kg).  General: NAD HEENT: normocephalic, anicteric Thyroid: no enlargement, no palpable nodules Pulmonary: No increased work of breathing, CTAB Cardiovascular: RRR, distal pulses 2+ Breast: Breast symmetrical, no tenderness, no palpable nodules or masses, no skin or nipple retraction present, no nipple discharge.  No axillary or supraclavicular lymphadenopathy. Abdomen: NABS, soft, non-tender, non-distended.  Umbilicus without lesions.  No hepatomegaly, splenomegaly or masses palpable. No evidence of hernia  Genitourinary:  External: Normal external female genitalia.  Normal urethral meatus, normal Bartholin's and Skene's glands.    Vagina: Normal vaginal mucosa, no evidence of prolapse.    Cervix: Grossly normal in appearance, no bleeding  Uterus: Non-enlarged, mobile, normal contour.  No CMT  Adnexa: ovaries non-enlarged, no adnexal masses  Rectal: deferred  Lymphatic: no  evidence of inguinal lymphadenopathy Extremities: no edema, erythema, or tenderness Neurologic: Grossly intact Psychiatric: mood appropriate, affect full  Female chaperone present for pelvic and breast  portions of the physical exam     Assessment: 53 y.o. [id number] routine annual exam  Plan: Problem List Items Addressed This Visit    None    Visit Diagnoses    Well woman exam without gynecological exam    -  Primary   Relevant Orders   Hgb A1c w/o eAG (Completed)   VITAMIN D 25 Hydroxy (Vit-D Deficiency, Fractures) (Completed)   Lipid Panel With LDL/HDL Ratio (Completed)   CBC with Differential/Platelet (Completed)   Blood tests for routine general physical examination       Relevant Orders  Hgb A1c w/o eAG (Completed)   VITAMIN D 25 Hydroxy (Vit-D Deficiency, Fractures) (Completed)   Lipid Panel With LDL/HDL Ratio (Completed)   CBC with Differential/Platelet (Completed)      1) Mammogram - recommend yearly screening mammogram.  Mammogram is scheduled for November  2) STI screening  was offered and declined  3) ASCCP guidelines and rationale discussed.  Patient opts for every 3 years screening interval  4) Osteoporosis  - per USPTF routine screening DEXA at age 55   Consider FDA-approved medical therapies in postmenopausal women and men aged 71 years and older, based on the following: a) A hip or vertebral (clinical or morphometric) fracture b) T-score ? -2.5 at the femoral neck or spine after appropriate evaluation to exclude secondary causes C) Low bone mass (T-score between -1.0 and -2.5 at the femoral neck or spine) and a 10-year probability of a hip fracture ? 3% or a 10-year probability of a major osteoporosis-related fracture ? 20% based on the [place]-adapted WHO algorithm   5) Routine healthcare maintenance including cholesterol, diabetes screening discussed Ordered today  6) Colonoscopy: Up to date- had normal screen in 2019.  Screening recommended starting at  age 48 for average risk individuals, age 80 for individuals deemed at increased risk (including African Americans) and recommended to continue until age 13.  For patient age 18-85 individualized approach is recommended.  Gold standard screening is via colonoscopy, Cologuard screening is an acceptable alternative for patient unwilling or unable to undergo colonoscopy.  "Colorectal cancer screening for average?risk adults: 2018 guideline update from the American Cancer Society"CA: A Cancer Journal for Clinicians: May 04, 2017   7) Return in about 1 year (around 09/04/2020) for annual established gyn.    Rod Can, [facility] 09/06/2019, 10:31 AM

## 2019-10-17 ENCOUNTER — Other Ambulatory Visit: Payer: Self-pay

## 2019-10-17 ENCOUNTER — Ambulatory Visit
Admission: RE | Admit: 2019-10-17 | Discharge: 2019-10-17 | Disposition: A | Discharge status: Home / Self Care | Payer: BC Managed Care – PPO | Source: Ambulatory Visit | Attending: Obstetrics and Gynecology | Admitting: Obstetrics and Gynecology

## 2019-10-17 DIAGNOSIS — Z1231 Encounter for screening mammogram for malignant neoplasm of breast: Secondary | ICD-10-CM

## 2019-10-18 ENCOUNTER — Encounter: Payer: Self-pay | Admitting: Obstetrics and Gynecology

## 2019-11-23 ENCOUNTER — Telehealth: Payer: Self-pay

## 2019-11-23 ENCOUNTER — Other Ambulatory Visit: Payer: Self-pay | Admitting: Advanced Practice Midwife

## 2019-11-23 NOTE — Telephone Encounter (Signed)
She saw Opal Sidles 9/20 who ordered the test. Please check with her about this. Thx.

## 2019-11-23 NOTE — Telephone Encounter (Signed)
Other migraine without status migrainosus, not intractable [G43.809]

## 2019-11-23 NOTE — Telephone Encounter (Signed)
Patient's Vitamin D Lab wasn't covered. Insurance states code needs to be medical need. Patient states it was covered last year. It is medical need. She has migraines and muscle aches. Her neurologist advised her to have the Vitamin D and that's why she asked for it. Requesting code be changed and [facility] asked to reprocess as she is sitting with a $200+. [id number]#[phone]

## 2019-12-03 NOTE — Telephone Encounter (Signed)
Spoke w/Katie at [employer] [facility]. Verbal given to refile 09/05/2019 Vitamin D lab w/dx code G43.809. Separate my chart message sent to notify patient.

## 2020-05-30 ENCOUNTER — Ambulatory Visit (INDEPENDENT_AMBULATORY_CARE_PROVIDER_SITE_OTHER): Payer: BC Managed Care – PPO | Admitting: Advanced Practice Midwife

## 2020-05-30 ENCOUNTER — Other Ambulatory Visit: Payer: Self-pay

## 2020-05-30 ENCOUNTER — Encounter: Payer: Self-pay | Admitting: Advanced Practice Midwife

## 2020-05-30 VITALS — BP 122/70 | Ht 63.0 in | Wt 141.0 lb

## 2020-05-30 DIAGNOSIS — N959 Unspecified menopausal and perimenopausal disorder: Secondary | ICD-10-CM | POA: Diagnosis not present

## 2020-05-30 NOTE — Progress Notes (Signed)
Patient ID: Helen Turner, female   DOB: 1966-09-17, 54 y.o.   MRN: [id number]  Reason for Consult: Pelvic Pain (pt tried to take Estradiol, and has had cramping since. )    Subjective:  HPI:   Helen Turner is a 54 y.o. female being seen for symptoms following hormone replacement therapy. She was seen by Hester Mates May 2021 in [facility] for treatment of hot flashes and was started on Estradiol patch. She began having symptoms of breast tenderness, cramping and a few days of vaginal spotting. She tried a lower dose and still had symptoms- especially breast tenderness and cramping. The patient stopped using the patch about 2 weeks due to intolerance of symptoms. She is wondering if the bleeding is concerning due to the fact that she has been post menopausal for the past 2 years. The severity and frequency of her hot flashes has improved.   She admits healthy lifestyle and eats plenty of vegetables. She does not like soy products.  We discussed her hormone response and alternative treatments including diet, Clonidine, environmental comfort measures. Plan made for follow up if bleeding continues as the bleeding she had seems closely associated with the use of the hormone replacement only. Follow up if hot flashes resume.  Past Medical History:  Diagnosis Date  . Acid reflux   . BRCA negative    MYRISK was negative/ lifetime risk of breast cancer of 23.2%  . Breast tenderness   . Degenerative disc disease, cervical   . Ectopic pregnancy    treated with methotrexate  . Family history of breast cancer   . Menometrorrhagia   . Migraine    Family History  Problem Relation Age of Onset  . Breast cancer Maternal Aunt 89  . Breast cancer Maternal Grandmother   . Breast cancer Cousin        mat cousin (maternal aunt's daughter)  . Osteoporosis Mother   . Endometriosis Mother   . Kidney cancer Father   . BRCA 1/2 Neg Hx    Past Surgical History:  Procedure Laterality Date  .  Bilateral tubal ligation  2008  . CESAREAN SECTION  2008   FITL  . ENDOMETRIAL ABLATION  08/31/2012   hysteroscopy/ D&C and Novasure  . LEEP  1996    Short Social History:  Social History   Tobacco Use  . Smoking status: Never Smoker  . Smokeless tobacco: Never Used  Substance Use Topics  . Alcohol use: No    Allergies  Allergen Reactions  . Codeine Nausea Only  . Amoxicillin-Pot Clavulanate Other (See Comments)    Nausea and causes worsening of symptoms in which she was treated for.   . Doxycycline Other (See Comments)    Nausea and causes worsening of symptoms in which she was treated for.   . Topiramate Other (See Comments)     Symptoms of fogginess    Current Outpatient Medications  Medication Sig Dispense Refill  . Cholecalciferol (VITAMIN D3) 2000 units capsule Take by mouth.    . esomeprazole (NEXIUM) 40 MG capsule Take 40 mg by mouth 2 (two) times daily.    Marland Kitchen omeprazole (PRILOSEC) 10 MG capsule Take 10 mg by mouth daily.    Marland Kitchen triamcinolone cream (KENALOG) 0.1 % 1(ONE) APPLICATION(S) TOPICAL 2(TWO) TIMES A DAY (Patient not taking: Reported on 05/30/2020)     No current facility-administered medications for this visit.   Review of Systems  Constitutional: Negative for chills and fever.  HENT: Negative for  congestion, ear discharge, ear pain, hearing loss, sinus pain and sore throat.   Eyes: Negative for blurred vision and double vision.  Respiratory: Negative for cough, shortness of breath and wheezing.   Cardiovascular: Negative for chest pain, palpitations and leg swelling.  Gastrointestinal: Negative for abdominal pain, blood in stool, constipation, diarrhea, heartburn, melena, nausea and vomiting.  Genitourinary: Negative for dysuria, flank pain, frequency, hematuria and urgency.       Positive for vaginal bleeding/discharge  Musculoskeletal: Negative for back pain, joint pain and myalgias.  Skin: Negative for itching and rash.  Neurological: Negative for  dizziness, tingling, tremors, sensory change, speech change, focal weakness, seizures, loss of consciousness, weakness and headaches.  Endo/Heme/Allergies: Negative for environmental allergies. Does not bruise/bleed easily.       Positive for hot flashes  Psychiatric/Behavioral: Negative for depression, hallucinations, memory loss, substance abuse and suicidal ideas. The patient is not nervous/anxious and does not have insomnia.        Positive for anxiety       Objective:  Objective   Vitals:   05/30/20 0850  BP: 122/70  Weight: 141 lb (64 kg)  Height: _0  (1.6 m)   Body mass index is 24.98 kg/m. Constitutional: Well nourished, well developed female in no acute distress.  HEENT: normal Skin: Warm and dry.  Cardiovascular: Regular rate and rhythm.   Extremity: no edema  Respiratory:  Normal respiratory effort Neuro: DTRs 2+, Cranial nerves grossly intact Psych: Alert and Oriented x3. No memory deficits. Normal mood and affect.  MS: normal gait, normal bilateral lower extremity ROM/strength/stability.  Limited physical exam. Greater than 50% of visit is spent in consultation.  Assessment/Plan:     54 y.o. G6 P3033 post-menopausal female with unwanted symptoms related to hormone replacement therapy.  Patient has already discontinued use of replacement hormones. She will increase alternative measures and may consider Clonidine if hot flashes resume. Follow up as needed if further vaginal bleeding   [facility] 05/30/2020, 11:40 AM

## 2020-09-05 ENCOUNTER — Ambulatory Visit: Payer: BC Managed Care – PPO | Admitting: Advanced Practice Midwife

## 2020-09-16 ENCOUNTER — Other Ambulatory Visit: Payer: Self-pay | Admitting: Obstetrics and Gynecology

## 2020-09-16 DIAGNOSIS — Z1231 Encounter for screening mammogram for malignant neoplasm of breast: Secondary | ICD-10-CM

## 2020-10-09 ENCOUNTER — Ambulatory Visit: Payer: BC Managed Care – PPO | Admitting: Advanced Practice Midwife

## 2020-10-16 ENCOUNTER — Ambulatory Visit (INDEPENDENT_AMBULATORY_CARE_PROVIDER_SITE_OTHER): Payer: BC Managed Care – PPO | Admitting: Advanced Practice Midwife

## 2020-10-16 ENCOUNTER — Other Ambulatory Visit (HOSPITAL_COMMUNITY)
Admission: RE | Admit: 2020-10-16 | Discharge: 2020-10-16 | Disposition: A | Discharge status: Home / Self Care | Payer: BC Managed Care – PPO | Source: Ambulatory Visit | Attending: Advanced Practice Midwife | Admitting: Advanced Practice Midwife

## 2020-10-16 ENCOUNTER — Other Ambulatory Visit: Payer: Self-pay

## 2020-10-16 ENCOUNTER — Encounter: Payer: Self-pay | Admitting: Advanced Practice Midwife

## 2020-10-16 VITALS — BP 114/70 | Ht 63.0 in | Wt 137.4 lb

## 2020-10-16 DIAGNOSIS — Z124 Encounter for screening for malignant neoplasm of cervix: Secondary | ICD-10-CM | POA: Diagnosis present

## 2020-10-16 DIAGNOSIS — Z01419 Encounter for gynecological examination (general) (routine) without abnormal findings: Secondary | ICD-10-CM | POA: Diagnosis present

## 2020-10-16 DIAGNOSIS — Z1239 Encounter for other screening for malignant neoplasm of breast: Secondary | ICD-10-CM

## 2020-10-16 DIAGNOSIS — Z23 Encounter for immunization: Secondary | ICD-10-CM | POA: Diagnosis not present

## 2020-10-16 DIAGNOSIS — Z8262 Family history of osteoporosis: Secondary | ICD-10-CM | POA: Diagnosis not present

## 2020-10-16 DIAGNOSIS — Z780 Asymptomatic menopausal state: Secondary | ICD-10-CM

## 2020-10-16 NOTE — Progress Notes (Signed)
Pt is here for a Annual Exam. °

## 2020-10-17 ENCOUNTER — Ambulatory Visit
Admission: RE | Admit: 2020-10-17 | Discharge: 2020-10-17 | Disposition: A | Discharge status: Home / Self Care | Payer: BC Managed Care – PPO | Source: Ambulatory Visit | Attending: Obstetrics and Gynecology | Admitting: Obstetrics and Gynecology

## 2020-10-17 DIAGNOSIS — Z1231 Encounter for screening mammogram for malignant neoplasm of breast: Secondary | ICD-10-CM

## 2020-10-20 ENCOUNTER — Encounter: Payer: Self-pay | Admitting: Advanced Practice Midwife

## 2020-10-20 LAB — CYTOLOGY - PAP
Comment: NEGATIVE
Diagnosis: NEGATIVE
High risk HPV: NEGATIVE

## 2020-10-20 NOTE — Progress Notes (Signed)
Gynecology Annual Exam  PCP: Sallee Lange, NP  Chief Complaint:  Chief Complaint  Patient presents with  . Gynecologic Exam    History of Present Illness:Patient is a 54 y.o. [id number] presents for annual exam. The patient has no complaints today. She reports some improvement with acid reflux and therefore with her anxiety. She reports that her mother had osteoporosis and she would like to have a bone density scan.  LMP: No LMP recorded. (Menstrual status: Perimenopausal). Postcoital Bleeding: no   The patient is sexually active. She denies dyspareunia.  The patient does perform self breast exams.  There is no notable family history of breast or ovarian cancer in her family.  The patient wears seatbelts: yes.   The patient has regular exercise: she walks regularly and does yoga. She admits healthy diet, adequate hydration and adequate sleep. She uses CBD oil to help her sleep.    The patient denies current symptoms of depression.     Review of Systems: Review of Systems  Constitutional: Negative for chills and fever.  HENT: Negative for congestion, ear discharge, ear pain, hearing loss, sinus pain and sore throat.   Eyes: Negative for blurred vision and double vision.  Respiratory: Negative for cough, shortness of breath and wheezing.   Cardiovascular: Negative for chest pain, palpitations and leg swelling.  Gastrointestinal: Negative for abdominal pain, blood in stool, constipation, diarrhea, heartburn, melena, nausea and vomiting.  Genitourinary: Negative for dysuria, flank pain, frequency, hematuria and urgency.  Musculoskeletal: Negative for back pain, joint pain and myalgias.  Skin: Negative for itching and rash.  Neurological: Negative for dizziness, tingling, tremors, sensory change, speech change, focal weakness, seizures, loss of consciousness, weakness and headaches.  Endo/Heme/Allergies: Negative for environmental allergies. Does not bruise/bleed easily.    Psychiatric/Behavioral: Negative for depression, hallucinations, memory loss, substance abuse and suicidal ideas. The patient is not nervous/anxious and does not have insomnia.     Past Medical History:  Patient Active Problem List   Diagnosis Date Noted  . Idiopathic neuropathy 08/18/2017  . Pain in joint involving ankle and foot 08/18/2017  . Migraine   . Degenerative disc disease, cervical   . BRCA negative     MYRISK was negative/ lifetime risk of breast cancer of 23.2%   . Family history of breast cancer   . Intractable chronic migraine without aura and with status migrainosus 01/12/2017    Past Surgical History:  Past Surgical History:  Procedure Laterality Date  . Bilateral tubal ligation  2008  . CESAREAN SECTION  2008   FITL  . ENDOMETRIAL ABLATION  08/31/2012   hysteroscopy/ D&C and Novasure  . LEEP  1996    Gynecologic History:  No LMP recorded. (Menstrual status: Perimenopausal). Last Pap: 3 years ago Results were:  no abnormalities  Last mammogram: 1 year ago Results were: BI-RAD I  Obstetric History: [id number]  Family History:  Family History  Problem Relation Age of Onset  . Breast cancer Maternal Aunt 89  . Breast cancer Maternal Grandmother   . Breast cancer Cousin        mat cousin (maternal aunt's daughter)  . Osteoporosis Mother   . Endometriosis Mother   . Kidney cancer Father   . BRCA 1/2 Neg Hx     Social History:  Social History   Socioeconomic History  . Marital status: Married    Spouse name: Not on file  . Number of children: 3  . Years of education: Not on file  .  Highest education level: Not on file  Occupational History  . Not on file  Tobacco Use  . Smoking status: Never Smoker  . Smokeless tobacco: Never Used  Vaping Use  . Vaping Use: Never used  Substance and Sexual Activity  . Alcohol use: No  . Drug use: No  . Sexual activity: Yes    Birth control/protection: None  Other Topics Concern  . Not on file  Social  History Narrative  . Not on file   Social Determinants of Health   Financial Resource Strain:   . Difficulty of Paying Living Expenses: Not on file  Food Insecurity:   . Worried About Charity fundraiser in the Last Year: Not on file  . Ran Out of Food in the Last Year: Not on file  Transportation Needs:   . Lack of Transportation (Medical): Not on file  . Lack of Transportation (Non-Medical): Not on file  Physical Activity:   . Days of Exercise per Week: Not on file  . Minutes of Exercise per Session: Not on file  Stress:   . Feeling of Stress : Not on file  Social Connections:   . Frequency of Communication with Friends and Family: Not on file  . Frequency of Social Gatherings with Friends and Family: Not on file  . Attends Religious Services: Not on file  . Active Member of Clubs or Organizations: Not on file  . Attends Archivist Meetings: Not on file  . Marital Status: Not on file  Intimate Partner Violence:   . Fear of Current or Ex-Partner: Not on file  . Emotionally Abused: Not on file  . Physically Abused: Not on file  . Sexually Abused: Not on file    Allergies:  Allergies  Allergen Reactions  . Codeine Nausea Only  . Amoxicillin-Pot Clavulanate Other (See Comments)    Nausea and causes worsening of symptoms in which she was treated for.   . Doxycycline Other (See Comments)    Nausea and causes worsening of symptoms in which she was treated for.   . Topiramate Other (See Comments)     Symptoms of fogginess    Medications: Prior to Admission medications   Medication Sig Start Date End Date Taking? Authorizing Provider  Alum Hydroxide-Mag Carbonate (GAVISCON PO) 2 ml by mouth daily   Yes [provider]  Ascorbic Acid (VITAMIN C) 100 MG tablet 1 tsp by mouth daily   Yes [provider]  cephALEXin (KEFLEX) 500 MG capsule Take 500 mg by mouth 2 (two) times daily. 10/14/20  Yes [provider]  Cholecalciferol (VITAMIN D3)  2000 units capsule Take by mouth.   Yes [provider]  esomeprazole (NEXIUM) 40 MG capsule Take 40 mg by mouth 2 (two) times daily. 05/31/19  Yes [provider]  omeprazole (PRILOSEC) 10 MG capsule Take 10 mg by mouth daily.   Yes [provider]  Vitamin D-Vitamin K (VITAMIN K2-VITAMIN D3 PO) 1200 mg by mouth daily   Yes [provider]  triamcinolone cream (KENALOG) 0.1 % 1(ONE) APPLICATION(S) TOPICAL 2(TWO) TIMES A DAY Patient not taking: Reported on 10/16/2020 03/27/19   [provider]    Physical Exam Vitals: Blood pressure 114/70, height 5' 3" (1.6 m), weight 137 lb 6.4 oz (62.3 kg).  General: NAD HEENT: normocephalic, anicteric Thyroid: no enlargement, no palpable nodules Pulmonary: No increased work of breathing, CTAB Cardiovascular: RRR, distal pulses 2+ Breast: Breast symmetrical, no tenderness, no palpable nodules or masses, no  skin or nipple retraction present, no nipple discharge.  No axillary or supraclavicular lymphadenopathy. Abdomen: NABS, soft, non-tender, non-distended.  Umbilicus without lesions.  No hepatomegaly, splenomegaly or masses palpable. No evidence of hernia  Genitourinary:  External: Normal external female genitalia.  Normal urethral meatus, normal Bartholin's and Skene's glands.    Vagina: Normal vaginal mucosa, no evidence of prolapse.    Cervix: Grossly normal in appearance, no bleeding  Uterus: Non-enlarged, mobile, normal contour.  No CMT  Adnexa: ovaries non-enlarged, no adnexal masses  Rectal: deferred  Lymphatic: no evidence of inguinal lymphadenopathy Extremities: no edema, erythema, or tenderness Neurologic: Grossly intact Psychiatric: mood appropriate, affect full     Assessment: 54 y.o. [id number] routine annual exam  Plan: Problem List Items Addressed This Visit    None    Visit Diagnoses    Well woman exam with routine gynecological exam    -  Primary   Relevant Orders   Cytology - PAP  (Completed)   DG Bone Density   MM DIGITAL SCREENING BILATERAL   Cervical cancer screening       Relevant Orders   Cytology - PAP (Completed)   Breast screening       Relevant Orders   MM DIGITAL SCREENING BILATERAL   Family history of osteoporosis in mother       Relevant Orders   DG Bone Density   Need for immunization against influenza       Relevant Orders   Flu Vaccine QUAD 36+ mos IM (Completed)      1) Mammogram - recommend yearly screening mammogram.  Mammogram Was ordered today  2) STI screening  was not offered and therefore not obtained  3) ASCCP guidelines and rationale discussed.  Patient opts for every 3 years screening interval  4) Osteoporosis  - per USPTF routine screening DEXA at age 64  Consider FDA-approved medical therapies in postmenopausal women and men aged 86 years and older, based on the following: a) A hip or vertebral (clinical or morphometric) fracture b) T-score ? -2.5 at the femoral neck or spine after appropriate evaluation to exclude secondary causes C) Low bone mass (T-score between -1.0 and -2.5 at the femoral neck or spine) and a 10-year probability of a hip fracture ? 3% or a 10-year probability of a major osteoporosis-related fracture ? 20% based on the [place]-adapted WHO algorithm   5) Routine healthcare maintenance including cholesterol, diabetes screening discussed Declines  6) Colonoscopy is up to date.  Screening recommended starting at age 42 for average risk individuals, age 27 for individuals deemed at increased risk (including African Americans) and recommended to continue until age 62.  For patient age 60-85 individualized approach is recommended.  Gold standard screening is via colonoscopy, Cologuard screening is an acceptable alternative for patient unwilling or unable to undergo colonoscopy.  "Colorectal cancer screening for average?risk adults: 2018 guideline update from the [place]: A Cancer Journal for Clinicians:  May 04, 2017   7) Return in about 1 year (around 10/16/2021) for annual established gyn.    Christean Leaf, CNM [facility] 10/20/20, 8:05 PM

## 2020-10-22 ENCOUNTER — Other Ambulatory Visit: Payer: Self-pay | Admitting: Advanced Practice Midwife

## 2020-10-29 ENCOUNTER — Ambulatory Visit
Admission: RE | Admit: 2020-10-29 | Discharge: 2020-10-29 | Disposition: A | Discharge status: Home / Self Care | Payer: BC Managed Care – PPO | Source: Ambulatory Visit | Attending: Advanced Practice Midwife | Admitting: Advanced Practice Midwife

## 2020-10-29 ENCOUNTER — Other Ambulatory Visit: Payer: Self-pay

## 2020-10-29 DIAGNOSIS — Z01419 Encounter for gynecological examination (general) (routine) without abnormal findings: Secondary | ICD-10-CM

## 2020-10-29 DIAGNOSIS — Z780 Asymptomatic menopausal state: Secondary | ICD-10-CM

## 2021-09-23 ENCOUNTER — Other Ambulatory Visit: Payer: Self-pay | Admitting: Nurse Practitioner

## 2021-09-23 DIAGNOSIS — Z1231 Encounter for screening mammogram for malignant neoplasm of breast: Secondary | ICD-10-CM

## 2021-10-20 ENCOUNTER — Other Ambulatory Visit: Payer: Self-pay

## 2021-10-20 ENCOUNTER — Ambulatory Visit
Admission: RE | Admit: 2021-10-20 | Discharge: 2021-10-20 | Disposition: A | Discharge status: Home / Self Care | Payer: BC Managed Care – PPO | Source: Ambulatory Visit | Attending: Nurse Practitioner | Admitting: Nurse Practitioner

## 2021-10-20 DIAGNOSIS — Z1231 Encounter for screening mammogram for malignant neoplasm of breast: Secondary | ICD-10-CM

## 2021-11-02 ENCOUNTER — Encounter: Payer: Self-pay | Admitting: Advanced Practice Midwife

## 2021-11-02 ENCOUNTER — Other Ambulatory Visit: Payer: Self-pay

## 2021-11-02 ENCOUNTER — Ambulatory Visit (INDEPENDENT_AMBULATORY_CARE_PROVIDER_SITE_OTHER): Payer: BC Managed Care – PPO | Admitting: Advanced Practice Midwife

## 2021-11-02 VITALS — BP 128/80 | HR 62 | Ht 63.0 in | Wt 144.6 lb

## 2021-11-02 DIAGNOSIS — Z0000 Encounter for general adult medical examination without abnormal findings: Secondary | ICD-10-CM

## 2021-11-02 DIAGNOSIS — N952 Postmenopausal atrophic vaginitis: Secondary | ICD-10-CM

## 2021-11-03 ENCOUNTER — Other Ambulatory Visit: Payer: Self-pay | Admitting: Advanced Practice Midwife

## 2021-11-03 ENCOUNTER — Encounter: Payer: Self-pay | Admitting: Advanced Practice Midwife

## 2021-11-03 DIAGNOSIS — Z0000 Encounter for general adult medical examination without abnormal findings: Secondary | ICD-10-CM

## 2021-11-03 DIAGNOSIS — N952 Postmenopausal atrophic vaginitis: Secondary | ICD-10-CM

## 2021-11-03 MED ORDER — INTRAROSA 6.5 MG VA INST: 6.5000 mg | VAGINAL_INSERT | Freq: Every day | VAGINAL | 11 refills | Status: DC

## 2021-11-03 NOTE — Patient Instructions (Signed)
Menopause Menopause is the normal time of a woman's life when menstrual periods stop completely. It marks the natural end to a woman's ability to become pregnant. It can be defined as the absence of a menstrual period for 12 months without another medical cause. The transition to menopause (perimenopause) most often happens between the ages of 45 and 55, and can last for many years. During perimenopause, hormone levels change in your body, which can cause symptoms and affect your health. Menopause may increase your risk for: Weakened bones (osteoporosis), which causes fractures. Depression. Hardening and narrowing of the arteries (atherosclerosis), which can cause heart attacks and strokes. What are the causes? This condition is usually caused by a natural change in hormone levels that happens as you get older. The condition may also be caused by changes that are not natural, including: Surgery to remove both ovaries (surgical menopause). Side effects from some medicines, such as chemotherapy used to treat cancer (chemical menopause). What increases the risk? This condition is more likely to start at an earlier age if you have certain medical conditions or have undergone treatments, including: A tumor of the pituitary gland in the brain. A disease that affects the ovaries and hormones. Certain cancer treatments, such as chemotherapy or hormone therapy, or radiation therapy on the pelvis. Heavy smoking and excessive alcohol use. Family history of early menopause. This condition is also more likely to develop earlier in women who are very thin. What are the signs or symptoms? Symptoms of this condition include: Hot flashes. Irregular menstrual periods. Night sweats. Changes in feelings about sex. This could be a decrease in sex drive or an increased discomfort around your sexuality. Vaginal dryness and thinning of the vaginal walls. This may cause painful sex. Dryness of the skin and  development of wrinkles. Headaches. Problems sleeping (insomnia). Mood swings or irritability. Memory problems. Weight gain. Hair growth on the face and chest. Bladder infections or problems with urinating. How is this diagnosed? This condition is diagnosed based on your medical history, a physical exam, your age, your menstrual history, and your symptoms. Hormone tests may also be done. How is this treated? In some cases, no treatment is needed. You and your health care provider should make a decision together about whether treatment is necessary. Treatment will be based on your individual condition and preferences. Treatment for this condition focuses on managing symptoms. Treatment may include: Menopausal hormone therapy (MHT). Medicines to treat specific symptoms or complications. Acupuncture. Vitamin or herbal supplements. Before starting treatment, make sure to let your health care provider know if you have a personal or family history of these conditions: Heart disease. Breast cancer. Blood clots. Diabetes. Osteoporosis. Follow these instructions at home: Lifestyle Do not use any products that contain nicotine or tobacco, such as cigarettes, e-cigarettes, and chewing tobacco. If you need help quitting, ask your health care provider. Get at least 30 minutes of physical activity on 5 or more days each week. Avoid alcoholic and caffeinated beverages, as well as spicy foods. This may help prevent hot flashes. Get 7-8 hours of sleep each night. If you have hot flashes, try: Dressing in layers. Avoiding things that may trigger hot flashes, such as spicy food, warm places, or stress. Taking slow, deep breaths when a hot flash starts. Keeping a fan in your home and office. Find ways to manage stress, such as deep breathing, meditation, or journaling. Consider going to group therapy with other women who are having menopause symptoms. Ask your health care   provider about recommended  group therapy meetings. Eating and drinking  Eat a healthy, balanced diet that contains whole grains, lean protein, low-fat dairy, and plenty of fruits and vegetables. Your health care provider may recommend adding more soy to your diet. Foods that contain soy include tofu, tempeh, and soy milk. Eat plenty of foods that contain calcium and vitamin D for bone health. Items that are rich in calcium include low-fat milk, yogurt, beans, almonds, sardines, broccoli, and kale. Medicines Take over-the-counter and prescription medicines only as told by your health care provider. Talk with your health care provider before starting any herbal supplements. If prescribed, take vitamins and supplements as told by your health care provider. General instructions  Keep track of your menstrual periods, including: When they occur. How heavy they are and how long they last. How much time passes between periods. Keep track of your symptoms, noting when they start, how often you have them, and how long they last. Use vaginal lubricants or moisturizers to help with vaginal dryness and improve comfort during sex. Keep all follow-up visits. This is important. This includes any group therapy or counseling. Contact a health care provider if: You are still having menstrual periods after age 55. You have pain during sex. You have not had a period for 12 months and you develop vaginal bleeding. Get help right away if you have: Severe depression. Excessive vaginal bleeding. Pain when you urinate. A fast or irregular heartbeat (palpitations). Severe headaches. Abdominal pain or severe indigestion. Summary Menopause is a normal time of life when menstrual periods stop completely. It is usually defined as the absence of a menstrual period for 12 months without another medical cause. The transition to menopause (perimenopause) most often happens between the ages of 45 and 55 and can last for several years. Symptoms  can be managed through medicines, lifestyle changes, and complementary therapies such as acupuncture. Eat a balanced diet that is rich in nutrients to promote bone health and heart health and to manage symptoms during menopause. This information is not intended to replace advice given to you by your health care provider. Make sure you discuss any questions you have with your health care provider. Document Revised: 08/22/2020 Document Reviewed: 05/08/2020 Elsevier Patient Education  2022 [employer]  

## 2021-11-03 NOTE — Progress Notes (Signed)
Gynecology Annual Exam  PCP: Sallee Lange, NP  Chief Complaint:  Chief Complaint  Patient presents with   Annual Exam    History of Present Illness:Patient is a 55 y.o. [phone] presents for annual exam. The patient has complaints today of hot flashes and weight gain related to post-menopause. She also mentions vaginal dryness and decreased libido. She has tried bio identical hormone replacement in the past and discontinued due to side effects. She has been under the care of a nutritionist for weight issue. We discussed normal physiological changes, the role of healthy lifestyle, and medication/pharmaceutical options.   LMP: Patient's last menstrual period was 07/11/2017.  Postcoital Bleeding: no   The patient is sexually active. She admits to dyspareunia.  The patient does perform self breast exams.  There is notable family history of breast or ovarian cancer in her family. The patient is BRCA gene negative.  The patient wears seatbelts: yes.   The patient has regular exercise:  she does yoga, strength exercises and walks regularly, she admits healthy/improved diet, adequate hydration and adequate sleep .  She is supplementing with Chia seeds, magnesium, Vitamin D for bone health. Her exercise level is currently limited by plantar fascitis.   The patient denies current symptoms of depression.     Review of Systems: Review of Systems  Constitutional:  Negative for chills and fever.  HENT:  Negative for congestion, ear discharge, ear pain, hearing loss, sinus pain and sore throat.   Eyes:  Negative for blurred vision and double vision.  Respiratory:  Negative for cough, shortness of breath and wheezing.   Cardiovascular:  Negative for chest pain, palpitations and leg swelling.  Gastrointestinal:  Negative for abdominal pain, blood in stool, constipation, diarrhea, heartburn, melena, nausea and vomiting.  Genitourinary:  Negative for dysuria, flank pain, frequency,  hematuria and urgency.  Musculoskeletal:  Negative for back pain, joint pain and myalgias.  Skin:  Negative for itching and rash.  Neurological:  Negative for dizziness, tingling, tremors, sensory change, speech change, focal weakness, seizures, loss of consciousness, weakness and headaches.  Endo/Heme/Allergies:  Negative for environmental allergies. Does not bruise/bleed easily.       Positive for weight gain, hot flashes, vaginal dryness, decreased libido  Psychiatric/Behavioral:  Negative for depression, hallucinations, memory loss, substance abuse and suicidal ideas. The patient is not nervous/anxious and does not have insomnia.    Past Medical History:  Patient Active Problem List   Diagnosis Date Noted   Idiopathic neuropathy 08/18/2017   Pain in joint involving ankle and foot 08/18/2017   Migraine    Degenerative disc disease, cervical    BRCA negative     MYRISK was negative/ lifetime risk of breast cancer of 23.2%    Family history of breast cancer    Intractable chronic migraine without aura and with status migrainosus 01/12/2017    Past Surgical History:  Past Surgical History:  Procedure Laterality Date   Bilateral tubal ligation  2008   CESAREAN SECTION  2008   FITL   ENDOMETRIAL ABLATION  08/31/2012   hysteroscopy/ D&C and Novasure   LEEP  1996    Gynecologic History:  Patient's last menstrual period was 07/11/2017. Last Pap: 1 year ago Results were:  no abnormalities  Last mammogram: 2 weeks ago Results were: BI-RAD I  Obstetric History: [id number]  Family History:  Family History  Problem Relation Age of Onset   Osteoporosis Mother    Endometriosis Mother    Kidney cancer  Father 72   Breast cancer Maternal Grandmother 39   Breast cancer Maternal Aunt 89   Breast cancer Cousin 86       mat cousin (maternal aunt's daughter)   BRCA 1/2 Neg Hx     Social History:  Social History   Socioeconomic History   Marital status: Married    Spouse name: Not  on file   Number of children: 3   Years of education: Not on file   Highest education level: Not on file  Occupational History   Not on file  Tobacco Use   Smoking status: Never   Smokeless tobacco: Never  Vaping Use   Vaping Use: Never used  Substance and Sexual Activity   Alcohol use: No   Drug use: No   Sexual activity: Yes    Birth control/protection: None  Other Topics Concern   Not on file  Social History Narrative   Not on file   Social Determinants of Health   Financial Resource Strain: Not on file  Food Insecurity: Not on file  Transportation Needs: Not on file  Physical Activity: Not on file  Stress: Not on file  Social Connections: Not on file  Intimate Partner Violence: Not on file    Allergies:  Allergies  Allergen Reactions   Codeine Nausea Only   Amoxicillin-Pot Clavulanate Other (See Comments)    Nausea and causes worsening of symptoms in which she was treated for.    Doxycycline Other (See Comments)    Nausea and causes worsening of symptoms in which she was treated for.    Topiramate Other (See Comments)     Symptoms of fogginess    Medications: Prior to Admission medications   Medication Sig Start Date End Date Taking? Authorizing Provider  Alum Hydroxide-Mag Carbonate (GAVISCON PO) 2 ml by mouth daily   Yes [provider]  omeprazole (PRILOSEC) 10 MG capsule Take 10 mg by mouth daily.   Yes [provider]  Vitamin D-Vitamin K (VITAMIN K2-VITAMIN D3 PO) 1200 mg by mouth daily   Yes [provider]  triamcinolone cream (KENALOG) 0.1 % 1(ONE) APPLICATION(S) TOPICAL 2(TWO) TIMES A DAY Patient not taking: Reported on 10/16/2020 03/27/19   [provider]    Physical Exam Vitals: Blood pressure 128/80, pulse 62, height '5\' 3"'  (1.6 m), weight 144 lb 9.6 oz (65.6 kg), last menstrual period 07/11/2017.  General: NAD HEENT: normocephalic, anicteric Thyroid: no enlargement, no palpable nodules Pulmonary: No  increased work of breathing, CTAB Cardiovascular: RRR, distal pulses 2+ Breast: Breast symmetrical, no tenderness, no palpable nodules or masses, no skin or nipple retraction present, no nipple discharge.  No axillary or supraclavicular lymphadenopathy. Abdomen: NABS, soft, non-tender, non-distended.  Umbilicus without lesions.  No hepatomegaly, splenomegaly or masses palpable. No evidence of hernia  Genitourinary:  External: Normal external female genitalia.  Normal urethral meatus, normal Bartholin's and Skene's glands.    Vagina: Normal vaginal mucosa, no evidence of prolapse.    Cervix: Grossly normal in appearance, no bleeding  Uterus: Non-enlarged, mobile, normal contour.  No CMT  Adnexa: ovaries non-enlarged, no adnexal masses  Rectal: deferred  Lymphatic: no evidence of inguinal lymphadenopathy Extremities: no edema, erythema, or tenderness Neurologic: Grossly intact Psychiatric: mood appropriate, affect full    Assessment: 55 y.o. [id number] routine annual exam  Plan: Problem List Items Addressed This Visit   None Visit Diagnoses     Well woman exam without gynecological exam    -  Primary   Relevant Medications  Prasterone (INTRAROSA) 6.5 MG INST   Post-menopausal atrophic vaginitis       Relevant Medications   Prasterone (INTRAROSA) 6.5 MG INST       1) Mammogram - recommend yearly screening mammogram.  Mammogram Is up to date. Ordered by PCP  2) STI screening  was offered and declined  3) ASCCP guidelines and rationale discussed.  Patient opts for every 3 years screening interval  4) Osteoporosis: Dexa scan 10/29/20 - per USPTF routine screening DEXA at age 62 - FRAX 75 year major fracture risk 20%,  10 year hip fracture risk 3%  Consider FDA-approved medical therapies in postmenopausal women and men aged 41 years and older, based on the following: a) A hip or vertebral (clinical or morphometric) fracture b) T-score ? -2.5 at the femoral neck or spine after  appropriate evaluation to exclude secondary causes C) Low bone mass (T-score between -1.0 and -2.5 at the femoral neck or spine) and a 10-year probability of a hip fracture ? 3% or a 10-year probability of a major osteoporosis-related fracture ? 20% based on the [place]-adapted WHO algorithm   5) Routine healthcare maintenance including cholesterol, diabetes screening discussed managed by PCP  6) Colonoscopy up to date per patient report.  Screening recommended starting at age 59 for average risk individuals, age 82 for individuals deemed at increased risk (including African Americans) and recommended to continue until age 62.  For patient age 74-85 individualized approach is recommended.  Gold standard screening is via colonoscopy, Cologuard screening is an acceptable alternative for patient unwilling or unable to undergo colonoscopy.  "Colorectal cancer screening for average?risk adults: 2018 guideline update from the American Cancer Society"CA: A Cancer Journal for Clinicians: May 04, 2017   7) Post-menopausal symptoms: Rx [place], dress in layers, continue healthy lifestyle  7) Return in about 1 year (around 11/02/2022) for annual established gyn.   Christean Leaf, CNM [facility] 11/03/21, 10:36 AM

## 2021-11-04 ENCOUNTER — Other Ambulatory Visit: Payer: Self-pay | Admitting: Advanced Practice Midwife

## 2021-12-01 ENCOUNTER — Telehealth: Payer: Self-pay

## 2021-12-01 NOTE — Telephone Encounter (Signed)
LMOM to call back to schedule appointment - received email from patient for appt

## 2021-12-03 ENCOUNTER — Ambulatory Visit: Payer: BC Managed Care – PPO | Admitting: Podiatry

## 2021-12-09 IMAGING — MG MM DIGITAL SCREENING BILAT W/ TOMO AND CAD
8 series · 9 of 24 positions shown · non-contrast
Comparison: Previous exam(s).

CLINICAL DATA: Screening.

EXAM:
DIGITAL SCREENING BILATERAL MAMMOGRAM WITH TOMOSYNTHESIS AND CAD
TECHNIQUE: Bilateral screening digital craniocaudal and mediolateral oblique
mammograms were obtained. Bilateral screening digital breast
tomosynthesis was performed. The images were evaluated with
computer-aided detection.

[L MLO synth-2D]
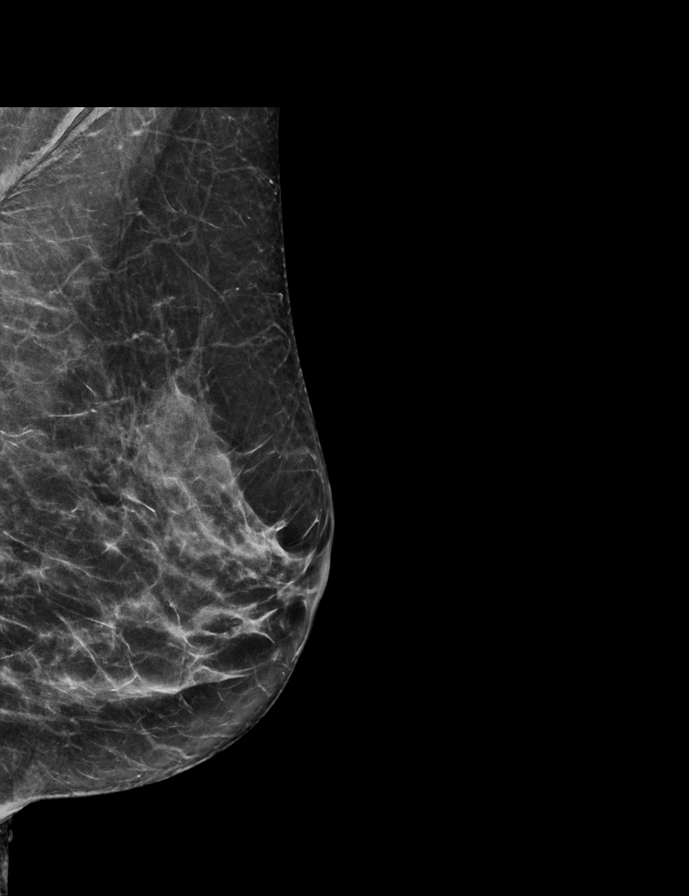

[L CC synth-2D]
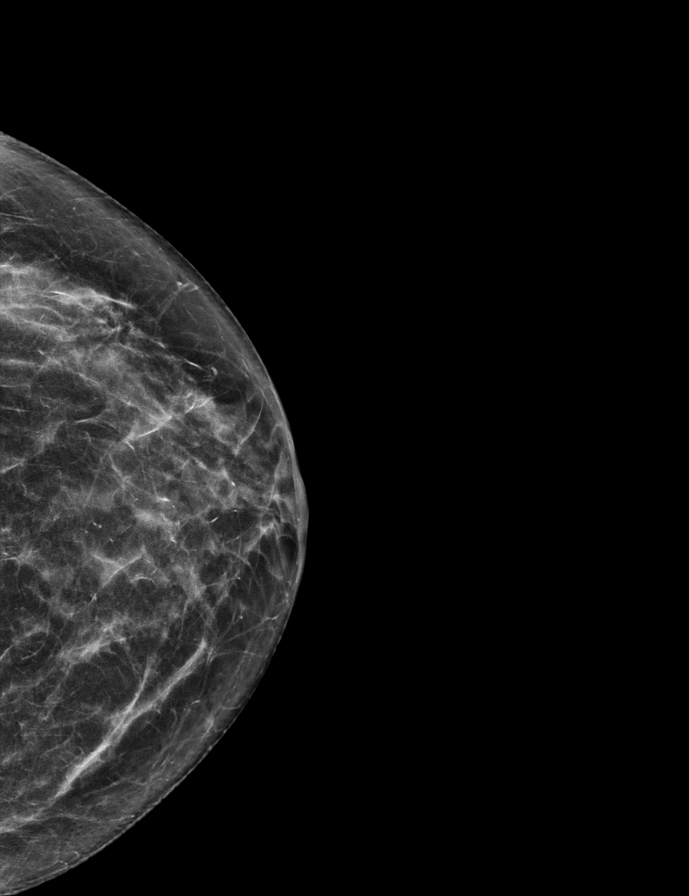

[R MLO synth-2D]
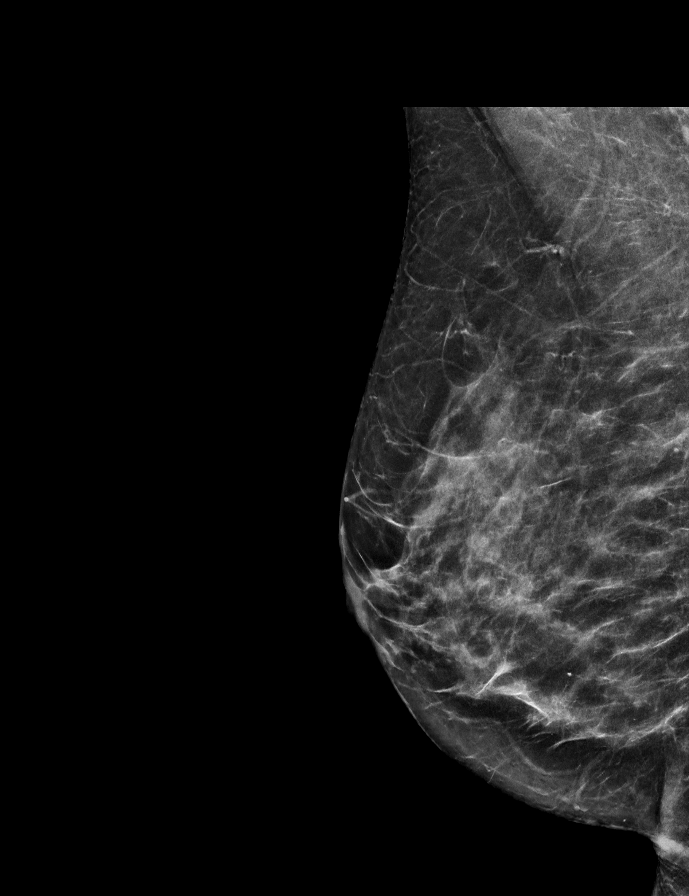

[R CC synth-2D]
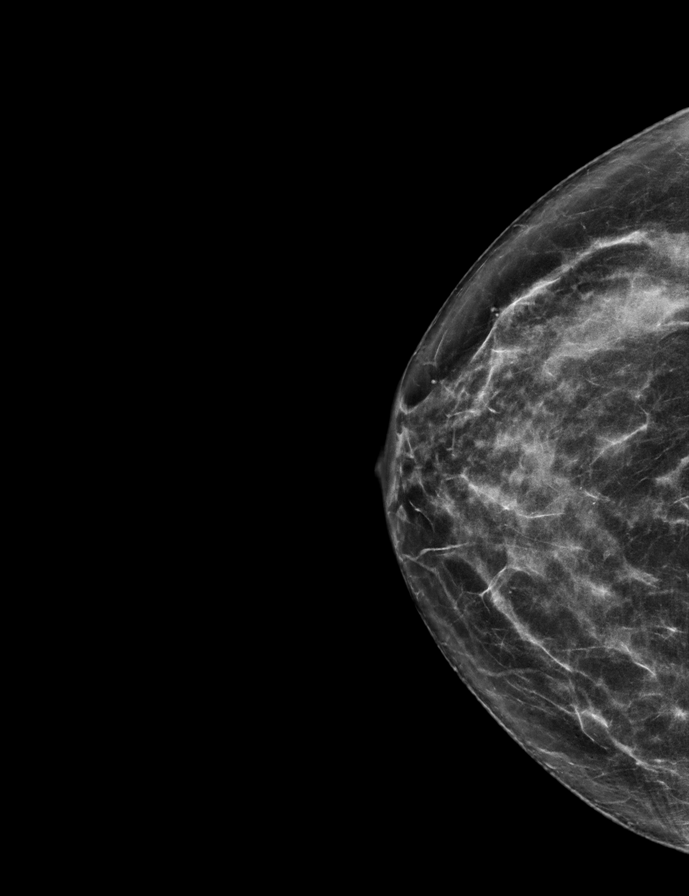

[L CC tomo · 2 of 59 frames shown]
[frame 20/59]
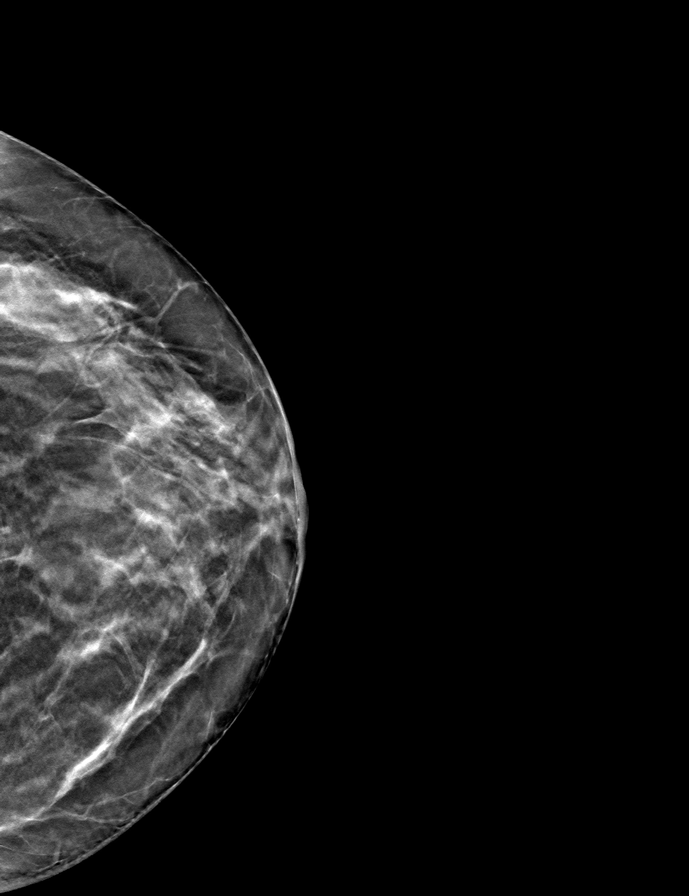
[frame 30/59]
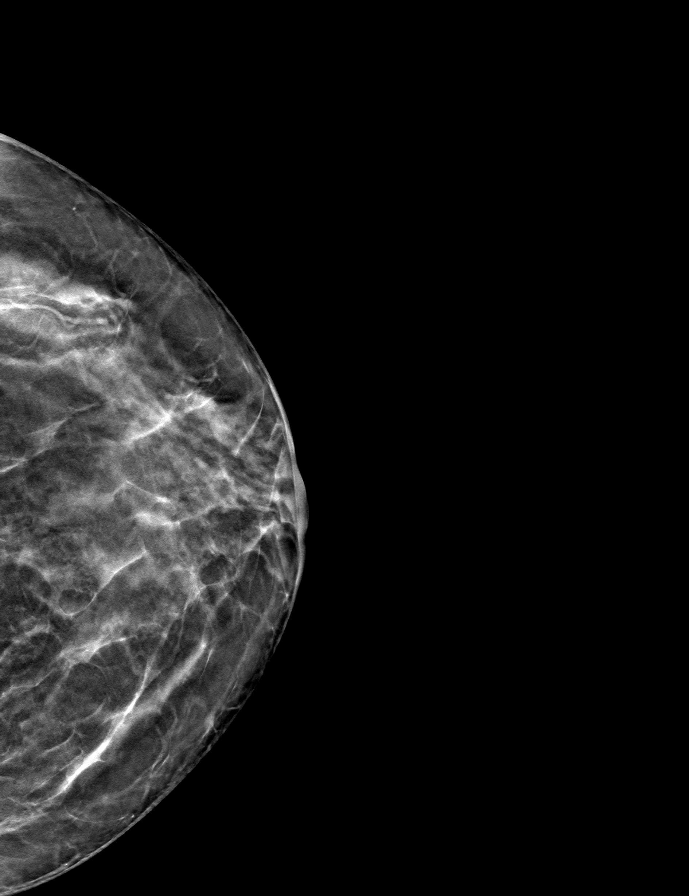

[R CC tomo · tomo slice 33/64.0]
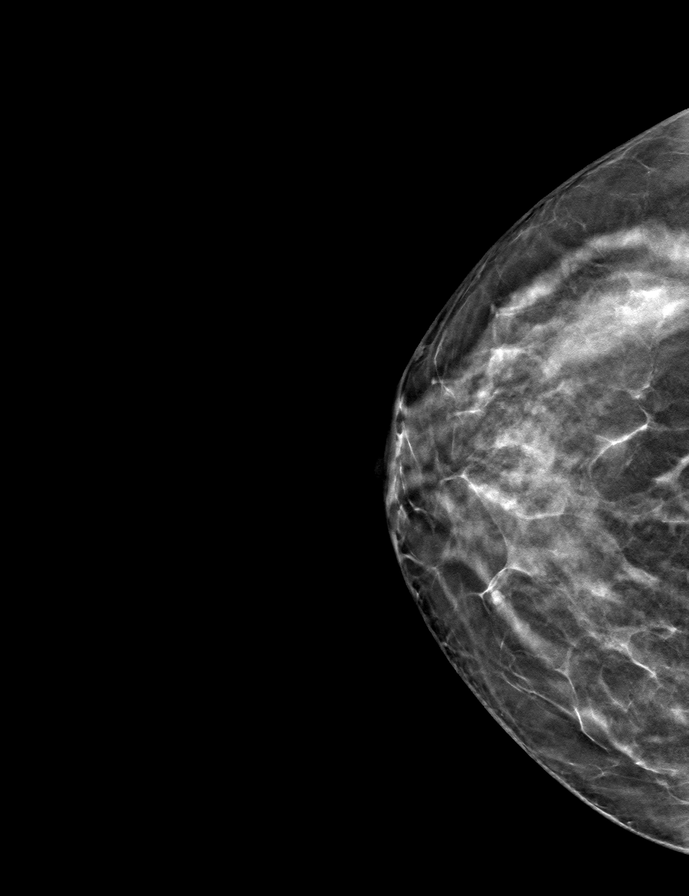

[R MLO tomo · tomo slice 31/61.0]
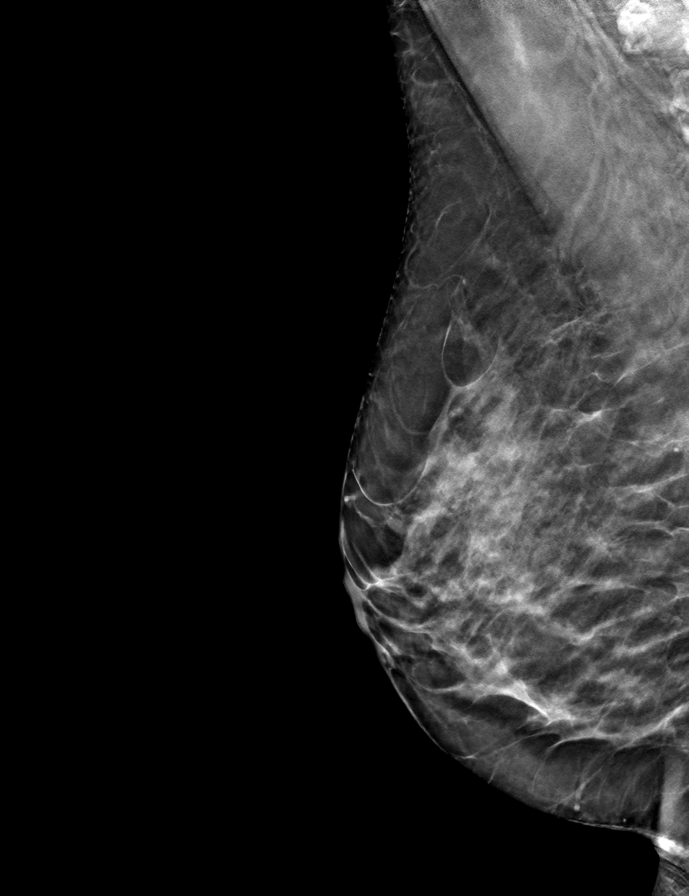

[L MLO tomo · tomo slice 30/59.0]
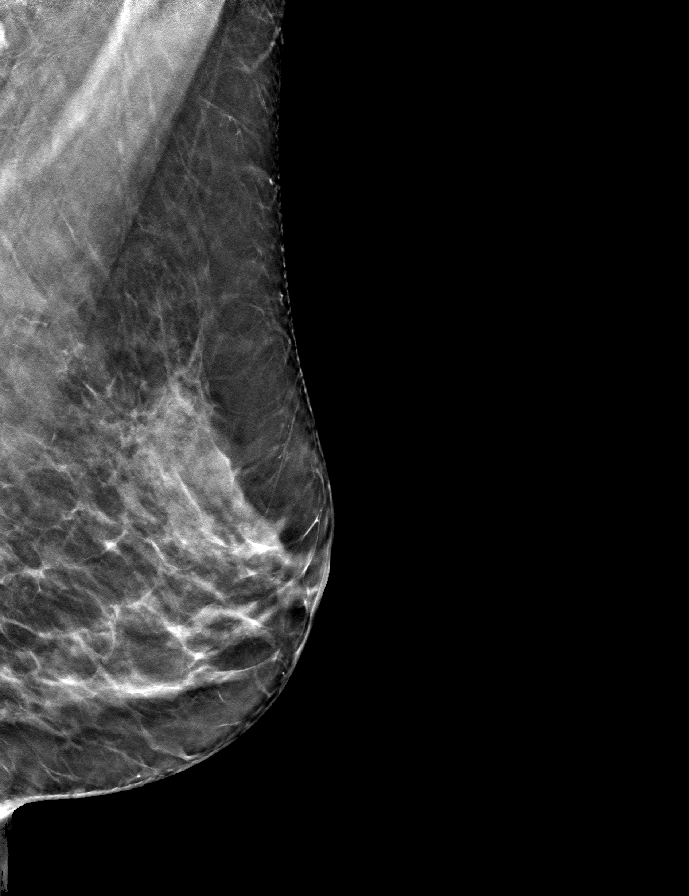

[9 of 24 positions shown; findings below may reference images not displayed]

ACR Breast Density Category c: The breast tissue is heterogeneously
dense, which may obscure small masses.
FINDINGS: There are no findings suspicious for malignancy.
IMPRESSION: No mammographic evidence of malignancy. A result letter of this
screening mammogram will be mailed directly to the patient.

RECOMMENDATION:
Screening mammogram in one year. (Code:Q3-W-BC3)

BI-RADS CATEGORY  1: Negative.

## 2022-03-17 ENCOUNTER — Encounter: Payer: Self-pay | Admitting: Obstetrics and Gynecology

## 2022-03-17 ENCOUNTER — Ambulatory Visit: Payer: BC Managed Care – PPO | Admitting: Obstetrics and Gynecology

## 2022-03-17 VITALS — BP 100/60 | Ht 63.0 in | Wt 144.0 lb

## 2022-03-17 DIAGNOSIS — R102 Pelvic and perineal pain: Secondary | ICD-10-CM | POA: Diagnosis not present

## 2022-03-17 DIAGNOSIS — Z780 Asymptomatic menopausal state: Secondary | ICD-10-CM

## 2022-03-17 LAB — POCT URINALYSIS DIPSTICK
Bilirubin, UA: NEGATIVE
Blood, UA: NEGATIVE
Glucose, UA: NEGATIVE
Ketones, UA: NEGATIVE
Leukocytes, UA: NEGATIVE
Nitrite, UA: NEGATIVE
Protein, UA: NEGATIVE
Spec Grav, UA: 1.025 (ref 1.010–1.025)
pH, UA: 6 (ref 5.0–8.0)

## 2022-03-17 NOTE — Progress Notes (Signed)
? ? ?Gauger, Victoriano Lain, NP ? ? ?Chief Complaint  ?Patient presents with  ? Vaginal Exam  ?  Some cramping in pelvic area, denies uti sx x 2 weeks  ? ? ?HPI: ?     Ms. Helen Turner is a 56 y.o. [phone] whose LMP was Patient's last menstrual period was 07/11/2017., presents today for pelvic cramping the past 2 wks. Sx are off and on, mild, random, last a few days then resolve. No relation to BM. No aggrav factors. NSAIDs/heating pad without relief. No GI sx, has BMs daily. No urin sx, no vag sx. Had colonoscopy about 5 yrs ago, repeat due after 10 yrs. Not on HRT but started DHEA vag supp 11/22 but has been using them more recently and wonders if there is a correlation.  ?She is s/p endometrial ablation. LMP 2018. No PMB. No hormone testing done in past to assess status of ovarian function.  ?She is sex active, no pelvic pain/bleeding. Does have occas vaginal dryness and vaginal pain. ? ? ?Patient Active Problem List  ? Diagnosis Date Noted  ? Idiopathic neuropathy 08/18/2017  ? Pain in joint involving ankle and foot 08/18/2017  ? Migraine   ? Degenerative disc disease, cervical   ? BRCA negative   ? Family history of breast cancer   ? Intractable chronic migraine without aura and with status migrainosus 01/12/2017  ? ? ?Past Surgical History:  ?Procedure Laterality Date  ? Bilateral tubal ligation  2008  ? CESAREAN SECTION  2008  ? FITL  ? ENDOMETRIAL ABLATION  08/31/2012  ? hysteroscopy/ D&C and Novasure  ? LEEP  1996  ? ? ?Family History  ?Problem Relation Age of Onset  ? Osteoporosis Mother   ? Endometriosis Mother   ? Kidney cancer Father 69  ? Breast cancer Maternal Grandmother 76  ? Breast cancer Maternal Aunt 63  ? Breast cancer Cousin 79  ?     mat cousin (maternal aunt's daughter)  ? BRCA 1/2 Neg Hx   ? ? ?Social History  ? ?Socioeconomic History  ? Marital status: Married  ?  Spouse name: Not on file  ? Number of children: 3  ? Years of education: Not on file  ? Highest education level: Not on  file  ?Occupational History  ? Not on file  ?Tobacco Use  ? Smoking status: Never  ? Smokeless tobacco: Never  ?Vaping Use  ? Vaping Use: Never used  ?Substance and Sexual Activity  ? Alcohol use: No  ? Drug use: No  ? Sexual activity: Yes  ?  Birth control/protection: None  ?Other Topics Concern  ? Not on file  ?Social History Narrative  ? Not on file  ? ?Social Determinants of Health  ? ?Financial Resource Strain: Not on file  ?Food Insecurity: Not on file  ?Transportation Needs: Not on file  ?Physical Activity: Not on file  ?Stress: Not on file  ?Social Connections: Not on file  ?Intimate Partner Violence: Not on file  ? ? ?Outpatient Medications Prior to Visit  ?Medication Sig Dispense Refill  ? Alum Hydroxide-Mag Carbonate (GAVISCON PO) 2 ml by mouth daily    ? fluticasone (FLONASE) 50 MCG/ACT nasal spray Place 2 sprays into both nostrils daily.    ? ibuprofen (ADVIL) 600 MG tablet Take 600 mg by mouth 3 (three) times daily.    ? omeprazole (PRILOSEC) 10 MG capsule Take 10 mg by mouth daily.    ? Prasterone (INTRAROSA) 6.5 MG INST Place  6.5 mg vaginally at bedtime. 30 each 11  ? rizatriptan (MAXALT) 5 MG tablet Take by mouth.    ? TURMERIC PO Take by mouth.    ? Vitamin D-Vitamin K (VITAMIN K2-VITAMIN D3 PO) 1200 mg by mouth daily    ? ?No facility-administered medications prior to visit.  ? ? ? ? ?ROS: ? ?Review of Systems  ?Constitutional:  Negative for fever.  ?Gastrointestinal:  Negative for blood in stool, constipation, diarrhea, nausea and vomiting.  ?Genitourinary:  Positive for pelvic pain. Negative for dyspareunia, dysuria, flank pain, frequency, hematuria, urgency, vaginal bleeding, vaginal discharge and vaginal pain.  ?Musculoskeletal:  Negative for back pain.  ?Skin:  Negative for rash.  ?BREAST: No symptoms ? ? ?OBJECTIVE:  ? ?Vitals:  ?BP 100/60   Ht _0  (1.6 m)   Wt 144 lb (65.3 kg)   LMP 07/11/2017   BMI 25.51 kg/m?  ? ?Physical Exam ?Vitals reviewed.  ?Constitutional:   ?   Appearance:  She is well-developed.  ?Pulmonary:  ?   Effort: Pulmonary effort is normal.  ?Abdominal:  ?   Palpations: Abdomen is soft.  ?   Tenderness: There is no abdominal tenderness. There is no guarding or rebound.  ?Genitourinary: ?   General: Normal vulva.  ?   Pubic Area: No rash.   ?   Labia:     ?   Right: No rash, tenderness or lesion.     ?   Left: No rash, tenderness or lesion.   ?   Vagina: Normal. No vaginal discharge, erythema or tenderness.  ?   Cervix: Normal.  ?   Uterus: Normal. Not enlarged and not tender.   ?   Adnexa: Right adnexa normal and left adnexa normal.    ?   Right: No mass or tenderness.      ?   Left: No mass or tenderness.    ?Musculoskeletal:     ?   General: Normal range of motion.  ?   Cervical back: Normal range of motion.  ?Skin: ?   General: Skin is warm and dry.  ?Neurological:  ?   General: No focal deficit present.  ?   Mental Status: She is alert and oriented to person, place, and time.  ?Psychiatric:     ?   Mood and Affect: Mood normal.     ?   Behavior: Behavior normal.     ?   Thought Content: Thought content normal.     ?   Judgment: Judgment normal.  ? ?Results:  ?Results for orders placed or performed in visit on 03/17/22 (from the past 24 hour(s))  ?POCT Urinalysis Dipstick     Status: Normal  ? Collection Time: 03/17/22  3:58 PM  ?Result Value Ref Range  ? Color, UA yellow   ? Clarity, UA clear   ? Glucose, UA Negative Negative  ? Bilirubin, UA neg   ? Ketones, UA neg   ? Spec Grav, UA 1.025 1.010 - 1.025  ? Blood, UA neg   ? pH, UA 6.0 5.0 - 8.0  ? Protein, UA Negative Negative  ? Urobilinogen, UA    ? Nitrite, UA neg   ? Leukocytes, UA Negative Negative  ? Appearance    ? Odor    ? ? ?Assessment/Plan: ?Pelvic cramping - Plan: US PELVIS TRANSVAGINAL NON-OB (TV ONLY); neg UA, neg exam. Check labs and GYN u/s. If neg GYN, could be GI. Pt also doing DHEA vag supp more frequently  now and could be a cause. Suggested pt stop them for now to assess sx.  ? ?Menopause - Plan: FSH,  Estradiol; check hormones to see if ovaries still functioning. Pt s/p endometrial ablation. If not postmenopausal, then cramping could be menstrual related.  ? ? Return in about 3 weeks (around 04/07/2022) for GYN u/s at [facility] for pelvic cramping--[facility] to call pt. ? ?Carlyne Keehan B. Sayed Apostol, PA-C ?03/17/2022 ?3:59 PM ? ? ? ? ? ?

## 2022-03-18 LAB — FOLLICLE STIMULATING HORMONE: FSH: 91.5 m[IU]/mL

## 2022-03-18 LAB — ESTRADIOL: Estradiol: <5 pg/mL

## 2022-04-15 ENCOUNTER — Ambulatory Visit (INDEPENDENT_AMBULATORY_CARE_PROVIDER_SITE_OTHER): Payer: BC Managed Care – PPO

## 2022-04-15 ENCOUNTER — Telehealth: Payer: Self-pay | Admitting: Obstetrics and Gynecology

## 2022-04-15 DIAGNOSIS — R102 Pelvic and perineal pain: Secondary | ICD-10-CM

## 2022-04-15 NOTE — Telephone Encounter (Signed)
Pt aware of GYN u/s results. Was having pelvic cramping but that has since resolved. No PMB; labs confirm menopause. S/p ablation; u/s shows bicornuate uterus but pt unaware of this, but imaging hard to tell details per ultrasonographer. ?Pt will f/u prn.  ?

## 2022-09-13 ENCOUNTER — Other Ambulatory Visit: Payer: Self-pay | Admitting: Nurse Practitioner

## 2022-09-13 ENCOUNTER — Other Ambulatory Visit: Payer: Self-pay | Admitting: Obstetrics and Gynecology

## 2022-09-13 DIAGNOSIS — Z1231 Encounter for screening mammogram for malignant neoplasm of breast: Secondary | ICD-10-CM

## 2022-10-22 ENCOUNTER — Ambulatory Visit
Admission: RE | Admit: 2022-10-22 | Discharge: 2022-10-22 | Disposition: A | Discharge status: Home / Self Care | Payer: BC Managed Care – PPO | Source: Ambulatory Visit | Attending: Obstetrics and Gynecology | Admitting: Obstetrics and Gynecology

## 2022-10-22 DIAGNOSIS — Z1231 Encounter for screening mammogram for malignant neoplasm of breast: Secondary | ICD-10-CM

## 2022-12-30 ENCOUNTER — Ambulatory Visit: Payer: BC Managed Care – PPO | Admitting: Obstetrics and Gynecology

## 2022-12-30 ENCOUNTER — Encounter: Payer: Self-pay | Admitting: Obstetrics and Gynecology

## 2022-12-30 VITALS — BP 110/70 | Ht 63.0 in | Wt 150.0 lb

## 2022-12-30 DIAGNOSIS — N898 Other specified noninflammatory disorders of vagina: Secondary | ICD-10-CM | POA: Diagnosis not present

## 2022-12-30 LAB — POCT WET PREP WITH KOH
Clue Cells Wet Prep HPF POC: NEGATIVE
KOH Prep POC: NEGATIVE
Trichomonas, UA: NEGATIVE
Yeast Wet Prep HPF POC: NEGATIVE

## 2022-12-30 MED ORDER — CLOTRIMAZOLE-BETAMETHASONE 1-0.05 % EX CREA: TOPICAL_CREAM | CUTANEOUS | 0 refills | Status: DC

## 2022-12-30 NOTE — Progress Notes (Signed)
Gauger, Victoriano Lain, NP   Chief Complaint  Patient presents with   Vaginal Irritation    Some itching, no discharge or odor x couple of weeks    HPI:      Ms. Helen Turner is a 57 y.o. [id number] whose LMP was Patient's last menstrual period was 07/11/2017., presents today for ext vaginal itching near clitoris for about a month. Sx started 2 wks after abx use. Treated with 1 diflucan and 2 rounds monistat with temporary relief. No increased vag d/c, odor. Using dove soap, dryer sheets. No urin sx.  She is sexually active, cont to have pain at LT introitus and on left side vaginally. Hasn't tried lubricants.   Patient Active Problem List   Diagnosis Date Noted   Idiopathic neuropathy 08/18/2017   Pain in joint involving ankle and foot 08/18/2017   Migraine    Degenerative disc disease, cervical    BRCA negative    Family history of breast cancer    Intractable chronic migraine without aura and with status migrainosus 01/12/2017    Past Surgical History:  Procedure Laterality Date   Bilateral tubal ligation  2008   CESAREAN SECTION  2008   FITL   ENDOMETRIAL ABLATION  08/31/2012   hysteroscopy/ D&C and Novasure   LEEP  1996    Family History  Problem Relation Age of Onset   Osteoporosis Mother    Endometriosis Mother    Kidney cancer Father 53   Breast cancer Maternal Grandmother 66   Breast cancer Maternal Aunt 89   Breast cancer Cousin 61       mat cousin (maternal aunt's daughter)   BRCA 1/2 Neg Hx     Social History   Socioeconomic History   Marital status: Married    Spouse name: Not on file   Number of children: 3   Years of education: Not on file   Highest education level: Not on file  Occupational History   Not on file  Tobacco Use   Smoking status: Never   Smokeless tobacco: Never  Vaping Use   Vaping Use: Never used  Substance and Sexual Activity   Alcohol use: No   Drug use: No   Sexual activity: Yes    Birth control/protection: None   Other Topics Concern   Not on file  Social History Narrative   Not on file   Social Determinants of Health   Financial Resource Strain: Not on file  Food Insecurity: Not on file  Transportation Needs: Not on file  Physical Activity: Not on file  Stress: Not on file  Social Connections: Not on file  Intimate Partner Violence: Not on file    Outpatient Medications Prior to Visit  Medication Sig Dispense Refill   Alum Hydroxide-Mag Carbonate (GAVISCON PO) 2 ml by mouth daily     fluticasone (FLONASE) 50 MCG/ACT nasal spray Place 2 sprays into both nostrils daily.     MAGNESIUM PO Take 3 capsules by mouth daily.     omeprazole (PRILOSEC) 10 MG capsule Take 10 mg by mouth daily.     rizatriptan (MAXALT) 5 MG tablet Take by mouth.     tazarotene (AVAGE) 0.1 % cream Apply 1 Application topically at bedtime.     Vitamin D-Vitamin K (VITAMIN K2-VITAMIN D3 PO) 1200 mg by mouth daily     ibuprofen (ADVIL) 600 MG tablet Take 600 mg by mouth 3 (three) times daily.     Prasterone (INTRAROSA) 6.5 MG INST Place  6.5 mg vaginally at bedtime. 30 each 11   TURMERIC PO Take by mouth.     No facility-administered medications prior to visit.      ROS:  Review of Systems  Constitutional:  Negative for fever.  Gastrointestinal:  Negative for blood in stool, constipation, diarrhea, nausea and vomiting.  Genitourinary:  Positive for dyspareunia. Negative for dysuria, flank pain, frequency, hematuria, urgency, vaginal bleeding, vaginal discharge and vaginal pain.  Musculoskeletal:  Negative for back pain.  Skin:  Negative for rash.   BREAST: No symptoms   OBJECTIVE:   Vitals:  BP 110/70   Ht 5\' 3"  (1.6 m)   Wt 150 lb (68 kg)   LMP 07/11/2017   BMI 26.57 kg/m   Physical Exam Vitals reviewed.  Constitutional:      Appearance: She is well-developed.  Pulmonary:     Effort: Pulmonary effort is normal.  Genitourinary:    General: Normal vulva.     Pubic Area: No rash.      Labia:         Right: No rash or lesion.        Left: Tenderness present. No rash or lesion.      Vagina: Normal. No vaginal discharge, erythema or tenderness.     Cervix: Normal.     Uterus: Normal. Not enlarged and not tender.      Adnexa: Right adnexa normal and left adnexa normal.       Right: No mass or tenderness.         Left: No mass or tenderness.      Musculoskeletal:        General: Normal range of motion.     Cervical back: Normal range of motion.  Skin:    General: Skin is warm and dry.  Neurological:     General: No focal deficit present.     Mental Status: She is alert and oriented to person, place, and time.  Psychiatric:        Mood and Affect: Mood normal.        Behavior: Behavior normal.        Thought Content: Thought content normal.        Judgment: Judgment normal.     Results: Results for orders placed or performed in visit on 12/30/22 (from the past 24 hour(s))  POCT Wet Prep with KOH     Status: Normal   Collection Time: 12/30/22  5:02 PM  Result Value Ref Range   Trichomonas, UA Negative    Clue Cells Wet Prep HPF POC neg    Epithelial Wet Prep HPF POC     Yeast Wet Prep HPF POC neg    Bacteria Wet Prep HPF POC     RBC Wet Prep HPF POC     WBC Wet Prep HPF POC     KOH Prep POC Negative Negative     Assessment/Plan: Vaginal itching - Plan: clotrimazole-betamethasone (LOTRISONE) cream, POCT Wet Prep with KOH; question fungal vs chem derm. Rx lotrisone crm, line dry underwear. F/u prn. If sx don't resolve, will add vag ERT ext.    Meds ordered this encounter  Medications   clotrimazole-betamethasone (LOTRISONE) cream    Sig: Apply externally BID prn sx up to 2 wks    Dispense:  15 g    Refill:  0    Order Specific Question:   Supervising Provider    Answer:   01/01/23      Return if symptoms  worsen or fail to improve.  Jaimeson Gopal B. Derik Fults, PA-C 12/30/2022 5:03 PM

## 2023-01-03 ENCOUNTER — Encounter: Payer: Self-pay | Admitting: Obstetrics and Gynecology

## 2023-01-03 DIAGNOSIS — N952 Postmenopausal atrophic vaginitis: Secondary | ICD-10-CM

## 2023-01-03 NOTE — Telephone Encounter (Signed)
Sample up front 

## 2023-01-10 MED ORDER — ESTRADIOL 0.1 MG/GM VA CREA: TOPICAL_CREAM | VAGINAL | 0 refills | Status: DC

## 2023-01-22 ENCOUNTER — Other Ambulatory Visit: Payer: Self-pay | Admitting: Obstetrics and Gynecology

## 2023-01-22 DIAGNOSIS — N952 Postmenopausal atrophic vaginitis: Secondary | ICD-10-CM

## 2023-02-28 ENCOUNTER — Ambulatory Visit: Payer: BC Managed Care – PPO | Admitting: Obstetrics and Gynecology

## 2023-03-07 ENCOUNTER — Ambulatory Visit: Payer: BC Managed Care – PPO | Admitting: Obstetrics and Gynecology

## 2023-04-28 NOTE — Progress Notes (Signed)
PCP: Myrene Buddy, NP   Chief Complaint  Patient presents with   Gynecologic Exam    No concerns    HPI:      Ms. Helen Turner is a 57 y.o. [id number] whose LMP was Patient's last menstrual period was 07/11/2017., presents today for her annual examination.  Her menses are absent due to endometrial ablation 2013. 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 Has occas VS sx.   Sex activity: single partner, contraception - post menopausal status. She does have vaginal dryness, improved with vag ERT, applies ext a few times wkly and 1 g vaginally once wkly. Still has some ext vag dryness sx. Using dryer sheets, sometimes wears synthetic underwear. Hasn't tried coconut oil.   Last Pap: 10/16/20 Results were: no abnormalities /neg HPV DNA.  Hx of STDs: HPV/s/p LEEP 1996  Last mammogram: 10/22/22 Results were: normal--routine follow-up in 12 months There is a FH of breast cancer in mat aunt, MGM, and cousin. There is no FH of ovarian cancer. The patient does do self-breast exams. Pt is MyRisk neg 2017; IBIS=21.7%/riskscore=23.2%. No screening MRI done, pt taking Vit D supp.   Colonoscopy: ~2018 Repeat due after 7-10 years. Pt has GI appt this fall.  Tobacco use: The patient denies current or previous tobacco use. Alcohol use: none No drug use Exercise: moderately active  She does get adequate calcium and Vitamin D in her diet.  Labs with PCP.   Patient Active Problem List   Diagnosis Date Noted   Idiopathic neuropathy 08/18/2017   Pain in joint involving ankle and foot 08/18/2017   Migraine    Degenerative disc disease, cervical    BRCA negative    Family history of breast cancer    Intractable chronic migraine without aura and with status migrainosus 01/12/2017    Past Surgical History:  Procedure Laterality Date   Bilateral tubal ligation  2008   CESAREAN SECTION  2008   FITL    ENDOMETRIAL ABLATION  08/31/2012   hysteroscopy/ D&C and Novasure   LEEP  1996    Family History  Problem Relation Age of Onset   Osteoporosis Mother    Endometriosis Mother    Kidney cancer Father 35   Breast cancer Maternal Grandmother 62   Breast cancer Maternal Aunt 62   Breast cancer Cousin 40       mat cousin (maternal aunt's daughter)   BRCA 1/2 Neg Hx     Social History   Socioeconomic History   Marital status: Married    Spouse name: Not on file   Number of children: 3   Years of education: Not on file   Highest education level: Not on file  Occupational History   Not on file  Tobacco Use   Smoking status: Never   Smokeless tobacco: Never  Vaping Use   Vaping Use: Never used  Substance and Sexual Activity   Alcohol use: No   Drug use: No   Sexual activity: Yes    Birth control/protection: None  Other Topics Concern   Not on file  Social History Narrative   Not on file   Social Determinants of Health   Financial Resource Strain: Not on file  Food Insecurity: Not on file  Transportation Needs: Not on file  Physical Activity: Not on file  Stress: Not on file  Social Connections: Not on file  Intimate Partner  Violence: Not on file     Current Outpatient Medications:    Alum Hydroxide-Mag Carbonate (GAVISCON PO), 2 ml by mouth daily, Disp: , Rfl:    fluticasone (FLONASE) 50 MCG/ACT nasal spray, Place 2 sprays into both nostrils daily., Disp: , Rfl:    MAGNESIUM PO, Take 3 capsules by mouth daily., Disp: , Rfl:    omeprazole (PRILOSEC) 10 MG capsule, Take 10 mg by mouth daily., Disp: , Rfl:    rizatriptan (MAXALT) 5 MG tablet, Take by mouth., Disp: , Rfl:    tazarotene (AVAGE) 0.1 % cream, Apply 1 Application topically at bedtime., Disp: , Rfl:    Vitamin D-Vitamin K (VITAMIN K2-VITAMIN D3 PO), 1200 mg by mouth daily, Disp: , Rfl:    estradiol (ESTRACE VAGINAL) 0.1 MG/GM vaginal cream, Insert 1 g vaginally once weekly and use pea size amount externally  QOHS, Disp: 42.5 g, Rfl: 1     ROS:  Review of Systems  Constitutional:  Negative for fatigue, fever and unexpected weight change.  Respiratory:  Negative for cough, shortness of breath and wheezing.   Cardiovascular:  Negative for chest pain, palpitations and leg swelling.  Gastrointestinal:  Negative for blood in stool, constipation, diarrhea, nausea and vomiting.  Endocrine: Negative for cold intolerance, heat intolerance and polyuria.  Genitourinary:  Negative for dyspareunia, dysuria, flank pain, frequency, genital sores, hematuria, menstrual problem, pelvic pain, urgency, vaginal bleeding, vaginal discharge and vaginal pain.  Musculoskeletal:  Negative for back pain, joint swelling and myalgias.  Skin:  Negative for rash.  Neurological:  Negative for dizziness, syncope, light-headedness, numbness and headaches.  Hematological:  Negative for adenopathy.  Psychiatric/Behavioral:  Negative for agitation, confusion, sleep disturbance and suicidal ideas. The patient is not nervous/anxious.    BREAST: No symptoms    Objective: BP 110/70   Ht 5\' 3"  (1.6 m)   Wt 147 lb (66.7 kg)   LMP 07/11/2017   BMI 26.04 kg/m    Physical Exam Constitutional:      Appearance: She is well-developed.  Genitourinary:     Vulva normal.     Right Labia: No rash, tenderness or lesions.    Left Labia: No tenderness, lesions or rash.    No vaginal discharge, erythema or tenderness.      Right Adnexa: not tender and no mass present.    Left Adnexa: not tender and no mass present.    No cervical friability or polyp.     Uterus is not enlarged or tender.  Breasts:    Right: No mass, nipple discharge, skin change or tenderness.     Left: No mass, nipple discharge, skin change or tenderness.  Neck:     Thyroid: No thyromegaly.  Cardiovascular:     Rate and Rhythm: Normal rate and regular rhythm.     Heart sounds: Normal heart sounds. No murmur heard. Pulmonary:     Effort: Pulmonary effort  is normal.     Breath sounds: Normal breath sounds.  Abdominal:     Palpations: Abdomen is soft.     Tenderness: There is no abdominal tenderness. There is no guarding or rebound.  Musculoskeletal:        General: Normal range of motion.     Cervical back: Normal range of motion.  Lymphadenopathy:     Cervical: No cervical adenopathy.  Neurological:     General: No focal deficit present.     Mental Status: She is alert and oriented to person, place, and time.  Cranial Nerves: No cranial nerve deficit.  Skin:    General: Skin is warm and dry.  Psychiatric:        Mood and Affect: Mood normal.        Behavior: Behavior normal.        Thought Content: Thought content normal.        Judgment: Judgment normal.  Vitals reviewed.    Assessment/Plan:  Encounter for annual routine gynecological examination  Encounter for screening mammogram for malignant neoplasm of breast - Plan: MM 3D SCREENING MAMMOGRAM BILATERAL BREAST; pt to schedule mammo 11/24  Family history of breast cancer - Plan: MM 3D SCREENING MAMMOGRAM BILATERAL BREAST; pt is MyRisk neg  Increased risk of breast cancer - Plan: MM 3D SCREENING MAMMOGRAM BILATERAL BREAST; aware of recommendations of monthly SBE, yearly CBE and mammos, as well as scr breast MRI. Will call for MRI ref prn. Cont Vit D supp  Post-menopausal atrophic vaginitis - Plan: estradiol (ESTRACE VAGINAL) 0.1 MG/GM vaginal cream; d/c dryer sheets/cotton underwear. Try coconut oil as moisturizer. Can increase to 1 g vag BID if needed. F/u prn.    Meds ordered this encounter  Medications   estradiol (ESTRACE VAGINAL) 0.1 MG/GM vaginal cream    Sig: Insert 1 g vaginally once weekly and use pea size amount externally QOHS    Dispense:  42.5 g    Refill:  1    Order Specific Question:   Supervising Provider    Answer:   Waymon Budge            GYN counsel breast self exam, mammography screening, menopause, adequate intake of calcium and  vitamin D, diet and exercise    F/U  Return in about 1 year (around 05/02/2024).  Malayjah Otoole B. Darragh Nay, PA-C 05/03/2023 12:37 PM

## 2023-05-03 ENCOUNTER — Encounter: Payer: Self-pay | Admitting: Obstetrics and Gynecology

## 2023-05-03 ENCOUNTER — Ambulatory Visit (INDEPENDENT_AMBULATORY_CARE_PROVIDER_SITE_OTHER): Payer: BC Managed Care – PPO | Admitting: Obstetrics and Gynecology

## 2023-05-03 VITALS — BP 110/70 | Ht 63.0 in | Wt 147.0 lb

## 2023-05-03 DIAGNOSIS — Z9189 Other specified personal risk factors, not elsewhere classified: Secondary | ICD-10-CM

## 2023-05-03 DIAGNOSIS — Z01419 Encounter for gynecological examination (general) (routine) without abnormal findings: Secondary | ICD-10-CM

## 2023-05-03 DIAGNOSIS — Z803 Family history of malignant neoplasm of breast: Secondary | ICD-10-CM

## 2023-05-03 DIAGNOSIS — N952 Postmenopausal atrophic vaginitis: Secondary | ICD-10-CM

## 2023-05-03 DIAGNOSIS — Z1211 Encounter for screening for malignant neoplasm of colon: Secondary | ICD-10-CM

## 2023-05-03 DIAGNOSIS — Z1231 Encounter for screening mammogram for malignant neoplasm of breast: Secondary | ICD-10-CM

## 2023-05-03 MED ORDER — ESTRADIOL 0.1 MG/GM VA CREA
TOPICAL_CREAM | VAGINAL | 1 refills | Status: AC
Start: 2023-05-03 — End: ?

## 2023-05-03 NOTE — Patient Instructions (Addendum)
I value your feedback and you entrusting us with your care. If you get a [facility] patient survey, I would appreciate you taking the time to let us know about your experience today. Thank you!  [facility] ([place])--[phone]  

## 2023-06-07 ENCOUNTER — Encounter: Payer: Self-pay | Admitting: Obstetrics and Gynecology

## 2023-06-07 DIAGNOSIS — M8588 Other specified disorders of bone density and structure, other site: Secondary | ICD-10-CM

## 2023-06-07 DIAGNOSIS — Z13820 Encounter for screening for osteoporosis: Secondary | ICD-10-CM

## 2023-08-04 ENCOUNTER — Other Ambulatory Visit: Payer: BC Managed Care – PPO

## 2023-08-29 ENCOUNTER — Other Ambulatory Visit: Payer: BC Managed Care – PPO

## 2023-09-08 ENCOUNTER — Encounter: Payer: Self-pay | Admitting: Obstetrics and Gynecology

## 2023-09-08 DIAGNOSIS — Z1231 Encounter for screening mammogram for malignant neoplasm of breast: Secondary | ICD-10-CM

## 2023-09-14 ENCOUNTER — Ambulatory Visit
Admission: RE | Admit: 2023-09-14 | Discharge: 2023-09-14 | Disposition: A | Discharge status: Home / Self Care | Payer: BC Managed Care – PPO | Source: Ambulatory Visit | Attending: Obstetrics and Gynecology | Admitting: Obstetrics and Gynecology

## 2023-09-14 DIAGNOSIS — M8588 Other specified disorders of bone density and structure, other site: Secondary | ICD-10-CM | POA: Insufficient documentation

## 2023-09-14 DIAGNOSIS — Z13820 Encounter for screening for osteoporosis: Secondary | ICD-10-CM | POA: Insufficient documentation

## 2023-09-19 ENCOUNTER — Encounter: Payer: Self-pay | Admitting: Obstetrics and Gynecology

## 2023-09-19 DIAGNOSIS — Z9189 Other specified personal risk factors, not elsewhere classified: Secondary | ICD-10-CM

## 2023-09-19 DIAGNOSIS — M85859 Other specified disorders of bone density and structure, unspecified thigh: Secondary | ICD-10-CM

## 2023-09-19 DIAGNOSIS — M8588 Other specified disorders of bone density and structure, other site: Secondary | ICD-10-CM

## 2023-10-24 ENCOUNTER — Ambulatory Visit
Admission: RE | Admit: 2023-10-24 | Discharge: 2023-10-24 | Disposition: A | Discharge status: Home / Self Care | Payer: BC Managed Care – PPO | Source: Ambulatory Visit | Attending: Obstetrics and Gynecology

## 2023-10-24 DIAGNOSIS — Z9189 Other specified personal risk factors, not elsewhere classified: Secondary | ICD-10-CM

## 2023-10-24 DIAGNOSIS — Z1231 Encounter for screening mammogram for malignant neoplasm of breast: Secondary | ICD-10-CM

## 2023-10-24 DIAGNOSIS — Z803 Family history of malignant neoplasm of breast: Secondary | ICD-10-CM

## 2023-11-08 ENCOUNTER — Other Ambulatory Visit: Payer: Self-pay

## 2023-11-26 ENCOUNTER — Other Ambulatory Visit: Payer: Self-pay

## 2023-11-26 DIAGNOSIS — M8588 Other specified disorders of bone density and structure, other site: Secondary | ICD-10-CM

## 2023-11-28 ENCOUNTER — Other Ambulatory Visit: Payer: BC Managed Care – PPO

## 2023-12-01 ENCOUNTER — Telehealth: Payer: Self-pay

## 2023-12-01 ENCOUNTER — Encounter: Payer: Self-pay | Admitting: "Endocrinology

## 2023-12-01 ENCOUNTER — Ambulatory Visit: Payer: BC Managed Care – PPO | Admitting: "Endocrinology

## 2023-12-01 VITALS — BP 122/80 | HR 61 | Resp 20 | Ht 63.0 in | Wt 150.2 lb

## 2023-12-01 DIAGNOSIS — M8589 Other specified disorders of bone density and structure, multiple sites: Secondary | ICD-10-CM

## 2023-12-01 DIAGNOSIS — Z9189 Other specified personal risk factors, not elsewhere classified: Secondary | ICD-10-CM

## 2023-12-01 LAB — VITAMIN D 1,25 DIHYDROXY
Vitamin D 1, 25 (OH)2 Total: 50 pg/mL (ref 18–72)
Vitamin D2 1, 25 (OH)2: <8 pg/mL
Vitamin D3 1, 25 (OH)2: 50 pg/mL

## 2023-12-01 LAB — RENAL FUNCTION PANEL
Albumin: 4.1 g/dL (ref 3.6–5.1)
BUN: 11 mg/dL (ref 7–25)
CO2: 31 mmol/L (ref 20–32)
Calcium: 9.2 mg/dL (ref 8.6–10.4)
Chloride: 106 mmol/L (ref 98–110)
Creat: 0.65 mg/dL (ref 0.50–1.03)
Glucose, Bld: 86 mg/dL (ref 65–99)
Phosphorus: 3.2 mg/dL (ref 2.5–4.5)
Potassium: 4.2 mmol/L (ref 3.5–5.3)
Sodium: 141 mmol/L (ref 135–146)

## 2023-12-01 LAB — PTH, INTACT AND CALCIUM
Calcium: 9.2 mg/dL (ref 8.6–10.4)
PTH: 47 pg/mL (ref 16–77)

## 2023-12-01 LAB — MAGNESIUM: Magnesium: 2.4 mg/dL (ref 1.5–2.5)

## 2023-12-01 LAB — VITAMIN D 25 HYDROXY (VIT D DEFICIENCY, FRACTURES): Vit D, 25-Hydroxy: 54 ng/mL (ref 30–100)

## 2023-12-01 MED ORDER — ALENDRONATE SODIUM 70 MG PO TABS: 70.0000 mg | ORAL_TABLET | ORAL | 11 refills | Status: AC

## 2023-12-01 NOTE — Telephone Encounter (Signed)
Patient called to let you know she take Neuro mag L-threonate 432mg  daily ( 3 pills total) Calcium 450 mg daily Vit D + K2 3000 international units  daily.

## 2023-12-01 NOTE — Progress Notes (Addendum)
 [facility] Note Altamese Teresita, MD    Referring Provider: Trenda Moots Primary Care Provider: Myrene Buddy, NP No chief complaint on file.   Assessment & Plan  There are no diagnoses linked to this encounter.   Osteopenia Likely age related.  BMD results suggest: -2.3 T score at L spine, worsened from -1.8 in 2021. Recommend to use calcium 600 mg twice daily and vitamin D 2000 units OTC supplements.  Discussed weight bearing exercise options and dietary supplements. Will evaluate for secondary causes of osteoporosis.  Meets indication for treatment due to high FRAX/fracture risk. Educated on risks and side effects of fosamax including but not limited to esophagitis, worsening GERD, atypical femoral fractures and osteonecrosis of the jaw. Advised to take medication first thing in the morning with plenty of water and stay upright for 30 minutes after taking the medication. No active dental contraindication at this time. Patient open to reclast if needed.   Next DXA due in 10/2025.  Take fall precautions, adequate dairy in  diet and exercises (aerobic, balancing and weight bearing) as tolerated.   Return in about 3 months (around 02/29/2024).  I have reviewed current medications, nurse's notes, allergies, vital signs, past medical and surgical history, family medical history, and social history for this encounter. Counseled patient on symptoms, examination findings, lab findings, imaging results, treatment decisions and monitoring and prognosis. The patient understood the recommendations and agrees with the treatment plan. All questions regarding treatment plan were fully answered.   Altamese Tranquillity, MD   12/01/23  History of Present Illness Helen Turner is a 57 y.o. year old female who presents to our clinic with osteopenia diagnosed in 2021. BMD completed on 09/2023 suggested osteopenia. She is currently taking Calcium and vitamin D, dose unknown.    Risk Factors screening:  History of low trauma fractures: No Family history of osteoporosis: Yes Hip fracture in first-degree relatives: Yes Smoking history: No Excessive alcohol intake >2 drinks/day: No Excessive caffeine intake >2 drinks/day: Yes Glucocorticoid use >5mg  prednisone/day for >3 months: No Rheumatoid arthritis history: No Premature/Surgical Menopause: No  Physical Exam  BP 122/80 (BP Location: Left Arm, Patient Position: Sitting, Cuff Size: Normal)   Pulse 61   Resp 20   Ht 5\' 3"  (1.6 m)   Wt 150 lb 3.2 oz (68.1 kg)   LMP 07/11/2017   SpO2 97%   BMI 26.61 kg/m  Constitutional: well developed, well nourished Head: normocephalic, atraumatic Eyes: sclera anicteric, no redness Neck: supple Lungs: normal respiratory effort Neurology: alert and oriented Skin: dry, no appreciable rashes Musculoskeletal: no appreciable defects Psychiatric: normal mood and affect  Allergies Allergies  Allergen Reactions   Codeine Nausea Only   Amoxicillin-Pot Clavulanate Other (See Comments)    Nausea and causes worsening of symptoms in which she was treated for.    Doxycycline Other (See Comments)    Nausea and causes worsening of symptoms in which she was treated for.    Topiramate Other (See Comments)     Symptoms of fogginess    Current Medications Patient's Medications  New Prescriptions   ALENDRONATE (FOSAMAX) 70 MG TABLET    Take 1 tablet (70 mg total) by mouth every 7 (seven) days. Take with a full glass of water on an empty stomach.  Previous Medications   ALUM HYDROXIDE-MAG CARBONATE (GAVISCON PO)    2 ml by mouth daily   ESTRADIOL (ESTRACE VAGINAL) 0.1 MG/GM VAGINAL CREAM    Insert 1 g vaginally  once weekly and use pea size amount externally QOHS   FLUTICASONE (FLONASE) 50 MCG/ACT NASAL SPRAY    Place 2 sprays into both nostrils daily.   MAGNESIUM PO    Take 3 capsules by mouth daily.   OMEPRAZOLE (PRILOSEC) 10 MG CAPSULE    Take 10 mg by mouth daily.    RIZATRIPTAN (MAXALT) 5 MG TABLET    Take by mouth.   TAZAROTENE (AVAGE) 0.1 % CREAM    Apply 1 Application topically at bedtime.   VITAMIN D-VITAMIN K (VITAMIN K2-VITAMIN D3 PO)    1200 mg by mouth daily  Modified Medications   No medications on file  Discontinued Medications   No medications on file     Past Medical History Past Medical History:  Diagnosis Date   Acid reflux    BRCA negative 09/2016   MYRISK was negative   Breast tenderness    Degenerative disc disease, cervical    Ectopic pregnancy    treated with methotrexate   Family history of breast cancer    Increased risk of breast cancer 09/2016   IBIS=21.7%/riskscore=23.2%   Menometrorrhagia    Migraine     Past Surgical History Past Surgical History:  Procedure Laterality Date   Bilateral tubal ligation  2008   CESAREAN SECTION  2008   FITL   ENDOMETRIAL ABLATION  08/31/2012   hysteroscopy/ D&C and Novasure   LEEP  1996    Family History family history includes Breast cancer (age of onset: 13) in her cousin; Breast cancer (age of onset: 9) in her maternal grandmother; Breast cancer (age of onset: 44) in her maternal aunt; Endometriosis in her mother; Kidney cancer (age of onset: 23) in her father; Osteoporosis in her mother.  Social History Social History   Socioeconomic History   Marital status: Married    Spouse name: Not on file   Number of children: 3   Years of education: Not on file   Highest education level: Not on file  Occupational History   Not on file  Tobacco Use   Smoking status: Never   Smokeless tobacco: Never  Vaping Use   Vaping status: Never Used  Substance and Sexual Activity   Alcohol use: No   Drug use: No   Sexual activity: Yes    Birth control/protection: None  Other Topics Concern   Not on file  Social History Narrative   Not on file   Social Drivers of Health   Financial Resource Strain: Not on file  Food Insecurity: Not on file  Transportation Needs: Not on  file  Physical Activity: Not on file  Stress: Not on file  Social Connections: Not on file  Intimate Partner Violence: Not At Risk (08/15/2023)   Received from [facility]   Humiliation, Afraid, Rape, and Kick questionnaire    Fear of Current or Ex-Partner: No    Emotionally Abused: No    Physically Abused: No    Sexually Abused: No    Laboratory Investigations No components found for: "CMP" No components found for: "BMP" No results found for: "GFR" Lab Results  Component Value Date   CREATININE 0.65 11/28/2023   No results found for: "CBC" No components found for: "LFT" No components found for: "VITD" Lab Results  Component Value Date   PTH 47 11/28/2023   No results found for: "TSH"  No components found for: "RENAL FUNCTION" No components found for: "MAGNESIUM"  Parts of this note may have been dictated using voice recognition  software. There may be variances in spelling and vocabulary which are unintentional. Not all errors are proofread. Please notify the There[facility] Parkin if any discrepancies are noted or if the meaning of any statement is not clear.

## 2023-12-02 ENCOUNTER — Telehealth: Payer: Self-pay | Admitting: Pharmacy Technician

## 2023-12-02 ENCOUNTER — Other Ambulatory Visit: Payer: Self-pay | Admitting: "Endocrinology

## 2023-12-02 DIAGNOSIS — M8580 Other specified disorders of bone density and structure, unspecified site: Secondary | ICD-10-CM | POA: Insufficient documentation

## 2023-12-02 DIAGNOSIS — M8588 Other specified disorders of bone density and structure, other site: Secondary | ICD-10-CM

## 2023-12-02 NOTE — Telephone Encounter (Signed)
Dr. Roosevelt Locks, Lorain Childes note:  Patient will be scheduled as soon as possible.  Auth Submission: NO AUTH NEEDED Site of care: Site of care: [facility] Payer: BCBS Medication & CPT/J Code(s) submitted: Reclast (Zolendronic acid) [id number] Route of submission (phone, fax, portal):  Phone # Fax # Auth type: Buy/Bill PB  Units/visits requested: 1 DOSE Reference number:  Approval from: 12/02/23 to 12/01/24

## 2023-12-05 ENCOUNTER — Encounter: Payer: Self-pay | Admitting: "Endocrinology

## 2023-12-13 ENCOUNTER — Encounter: Payer: Self-pay | Admitting: Obstetrics and Gynecology

## 2023-12-13 ENCOUNTER — Encounter: Payer: Self-pay | Admitting: "Endocrinology

## 2023-12-13 ENCOUNTER — Ambulatory Visit: Payer: 59 | Admitting: Obstetrics and Gynecology

## 2023-12-13 VITALS — BP 136/81 | HR 70 | Ht 63.0 in | Wt 148.0 lb

## 2023-12-13 DIAGNOSIS — N941 Unspecified dyspareunia: Secondary | ICD-10-CM

## 2023-12-13 DIAGNOSIS — R102 Pelvic and perineal pain: Secondary | ICD-10-CM

## 2023-12-13 DIAGNOSIS — N952 Postmenopausal atrophic vaginitis: Secondary | ICD-10-CM

## 2023-12-13 NOTE — Patient Instructions (Signed)
 I value your feedback and you entrusting [place] with your care. If you get a [place] patient survey, I would appreciate you taking the time to let us know about your experience today. Thank you! ? ? ?

## 2023-12-13 NOTE — Progress Notes (Signed)
 Gauger, Lauraine Collar, NP   Chief Complaint  Patient presents with   Vaginal Itching    Irritation, no discharge or odor x 2 years    HPI:      Ms. Helen Turner is a 58 y.o. [id number] whose LMP was Patient's last menstrual period was 07/11/2017., presents today for persistent vaginal itching/burning externally, feels windburn. Pt states sx for the pas 2 yrs and always getting worse and she is beyone frustrated understandably. Has been treating with vag ERT externally nightly and 1 g vaginally once wkly without improvement. Saw pelvic PT for dyspareunia (pt has 1 area inside vaginally to the LT that hurts with sex and is in area of episiotomy). PT thought pt had LS due to pale nature of bilat labia minora. Pt sometimes has fissures in labial crease between minora and majora. Pt was given clobetasol oint by PCP this summer to try to treat for LS but just started using it BID the past 5 days without sx change. Uses water or mild free soap to wash, blots to dry, wears silky underwear or no underwear when home, no tight leggings/clothes. Applies A&D oint after voiding and blotting to soothe skin. No relief with coconut oil. Can't have sex due to pain. No increased d/c or odor.  I last saw pt for sx 5/24.   Patient Active Problem List   Diagnosis Date Noted   Osteopenia 12/02/2023   Idiopathic neuropathy 08/18/2017   Pain in joint involving ankle and foot 08/18/2017   Migraine    Degenerative disc disease, cervical    BRCA negative    Family history of breast cancer    Intractable chronic migraine without aura and with status migrainosus 01/12/2017    Past Surgical History:  Procedure Laterality Date   Bilateral tubal ligation  2008   CESAREAN SECTION  2008   FITL   ENDOMETRIAL ABLATION  08/31/2012   hysteroscopy/ D&C and Novasure   LEEP  1996    Family History  Problem Relation Age of Onset   Osteoporosis Mother    Endometriosis Mother    Kidney cancer Father 75    Breast cancer Maternal Grandmother 19   Breast cancer Maternal Aunt 52   Breast cancer Cousin 40       mat cousin (maternal aunt's daughter)   BRCA 1/2 Neg Hx     Social History   Socioeconomic History   Marital status: Married    Spouse name: Not on file   Number of children: 3   Years of education: Not on file   Highest education level: Not on file  Occupational History   Not on file  Tobacco Use   Smoking status: Never   Smokeless tobacco: Never  Vaping Use   Vaping status: Never Used  Substance and Sexual Activity   Alcohol use: No   Drug use: No   Sexual activity: Yes    Birth control/protection: None  Other Topics Concern   Not on file  Social History Narrative   Not on file   Social Drivers of Health   Financial Resource Strain: Not on file  Food Insecurity: Not on file  Transportation Needs: Not on file  Physical Activity: Not on file  Stress: Not on file  Social Connections: Not on file  Intimate Partner Violence: Not At Risk (08/15/2023)   Received from [facility]   Humiliation, Afraid, Rape, and Kick questionnaire    Fear of Current or Ex-Partner: No  Emotionally Abused: No    Physically Abused: No    Sexually Abused: No    Outpatient Medications Prior to Visit  Medication Sig Dispense Refill   Alum Hydroxide-Mag Carbonate (GAVISCON PO) 2 ml by mouth daily     Ascorbic Acid (VITAMIN C PO)      esomeprazole (NEXIUM) 40 MG capsule Take by mouth.     estradiol  (ESTRACE  VAGINAL) 0.1 MG/GM vaginal cream Insert 1 g vaginally once weekly and use pea size amount externally QOHS 42.5 g 1   fluticasone (FLONASE) 50 MCG/ACT nasal spray Place 2 sprays into both nostrils daily.     MAGNESIUM PO Take 3 capsules by mouth daily.     tazarotene (AVAGE) 0.1 % cream Apply 1 Application topically at bedtime.     Vitamin D -Vitamin K (VITAMIN K2-VITAMIN D3 PO) 1200 mg by mouth daily     alendronate  (FOSAMAX ) 70 MG tablet Take 1 tablet (70 mg total) by mouth every  7 (seven) days. Take with a full glass of water on an empty stomach. (Patient not taking: Reported on 12/13/2023) 4 tablet 11   omeprazole (PRILOSEC) 10 MG capsule Take 10 mg by mouth daily.     rizatriptan (MAXALT) 5 MG tablet Take by mouth.     No facility-administered medications prior to visit.      ROS:  Review of Systems  Constitutional:  Negative for fever.  Gastrointestinal:  Negative for blood in stool, constipation, diarrhea, nausea and vomiting.  Genitourinary:  Positive for dyspareunia and vaginal pain. Negative for dysuria, flank pain, frequency, hematuria, urgency, vaginal bleeding and vaginal discharge.  Musculoskeletal:  Negative for back pain.  Skin:  Negative for rash.   BREAST: No symptoms   OBJECTIVE:   Vitals:  BP 136/81   Pulse 70   Ht 5' 3 (1.6 m)   Wt 148 lb (67.1 kg)   LMP 07/11/2017   BMI 26.22 kg/m   Physical Exam Constitutional:      Appearance: Normal appearance.  Pulmonary:     Effort: Pulmonary effort is normal.  Genitourinary:    Labia:        Right: Tenderness present. No lesion.        Left: Tenderness present. No lesion.     Musculoskeletal:        General: Normal range of motion.  Neurological:     Mental Status: She is alert and oriented to person, place, and time.  Psychiatric:        Judgment: Judgment normal.    Assessment/Plan: Vaginal atrophy--labia look dry and thin and atrophic despite daily vag ERT use. Pt has clobetasol oint and will use at bedtime for 4 wks (to treat for LS if that is dx). In meantime, I have reached out to Signe Conn at [facility] to see about compounding estriol vag crm vs other for atrophic sx. Will f/u with pt.   Vaginal pain--d/c A&D in case too drying. Try vaseline or aquafor in meantime. Change to cotton underwear.   Dyspareunia in female--will address ext labia sx first. Pt may benefit from lidocaine crm at area of episiotomy site vs vag xanax.     Return if symptoms worsen or fail to  improve.  Margrett Kalb B. Jazir Newey, PA-C 12/13/2023 5:25 PM

## 2023-12-15 ENCOUNTER — Telehealth: Payer: Self-pay | Admitting: Obstetrics and Gynecology

## 2023-12-15 NOTE — Telephone Encounter (Addendum)
 Pt seen 12/13/23 with severe labial dryness/itch/burning and atrophy. Discussed with Signe at [facility]. Will try Biest 0.25 mg (50/50) plus testosterone 0.5 mg/ml externally, #30 ml to see if sx improve. Also recommended pt try cerave healing oint which contains hyaluronic acid (pt tried HA in past without change) and ceramides. Pt has stopped A&D. Pt to f/u via phone with sx in a couple wks. I'm still researching options as well.

## 2023-12-19 ENCOUNTER — Encounter: Payer: Self-pay | Admitting: Obstetrics and Gynecology

## 2023-12-19 DIAGNOSIS — R102 Pelvic and perineal pain: Secondary | ICD-10-CM

## 2023-12-19 DIAGNOSIS — N952 Postmenopausal atrophic vaginitis: Secondary | ICD-10-CM

## 2024-01-27 ENCOUNTER — Ambulatory Visit: Payer: 59

## 2024-01-27 VITALS — BP 99/67 | HR 61 | Temp 98.0°F | Resp 14 | Ht 63.0 in | Wt 145.6 lb

## 2024-01-27 DIAGNOSIS — M8588 Other specified disorders of bone density and structure, other site: Secondary | ICD-10-CM

## 2024-01-27 MED ORDER — IBUPROFEN 200 MG PO TABS
400.0000 mg | ORAL_TABLET | Freq: Once | ORAL | Status: AC
  Administered 2024-01-27: 400 mg via ORAL
  Filled 2024-01-27: qty 2

## 2024-01-27 MED ORDER — SODIUM CHLORIDE 0.9 % IV SOLN
INTRAVENOUS | Status: DC
Start: 2024-01-27 — End: 2024-01-27

## 2024-01-27 MED ORDER — ZOLEDRONIC ACID 5 MG/100ML IV SOLN
5.0000 mg | Freq: Once | INTRAVENOUS | Status: AC
  Administered 2024-01-27: 5 mg via INTRAVENOUS
  Filled 2024-01-27: qty 100

## 2024-01-27 MED ORDER — DIPHENHYDRAMINE HCL 25 MG PO CAPS
25.0000 mg | ORAL_CAPSULE | Freq: Once | ORAL | Status: AC
  Administered 2024-01-27: 25 mg via ORAL
  Filled 2024-01-27: qty 1

## 2024-01-27 NOTE — Progress Notes (Signed)
 Diagnosis: Osteoporosis  Provider:  Chilton Greathouse MD  Procedure: IV Infusion  IV Type: Peripheral, IV Location: R Antecubital  Reclast (Zolendronic Acid), Dose: 5 mg  Infusion Start Time: 1636  Infusion Stop Time: 1708  Post Infusion IV Care: Observation period completed and Peripheral IV Discontinued  Discharge: Condition: Good, Destination: Home . AVS Declined  Performed by:  Loney Hering, LPN

## 2024-01-31 ENCOUNTER — Encounter: Payer: Self-pay | Admitting: "Endocrinology

## 2024-02-03 NOTE — Telephone Encounter (Addendum)
 I am sorry to hear that she had a bad flulike symptoms related to taking Reclast infusion.  Musculoskeletal pain or flulike symptoms are known side effect related to Reclast IV infusion, severity and duration of side effects are variable from person-to-person. She had Reclast infusion on February 21, it has been 7 days today.  I hope her symptoms are improved / improving and feeling better.  If she is still having symptoms, I would recommend to take Tylenol 500 mg  or ibuprofen 400 mg as needed for pain every 6 hours and well hydration with water intake.  These medications are over-the-counter.  I would recommend to take Tylenol or ibuprofen with food, not in empty stomach.  [place] Momoko Slezak, MD [facility] Endocr[facility]ology [facility] [address] [place], [address] [place], [place] [address] Phone # [phone]

## 2024-02-06 NOTE — Telephone Encounter (Signed)
 Usually flulike symptoms occurs within 24 to 72 hours of the infusion.  However although it is rare some patients have experienced musculoskeletal pain which means muscle, bone or joint pain within days after infusion.  I would think it is normal side effects from the Reclast infusion for you to have neck, low back and toe pain.  It is okay to take ibuprofen if it works better for you, I would recommend not to take on empty stomach to reduce gastrointestinal side effect.  Answering in the absence of Dr. Roosevelt Locks, I will forward this conversation to Dr. Roosevelt Locks as well.  [place]raq Helen Birchler, MD [facility] Endocrinology [facility] [address] [place], [address] [place], [place] [address] Phone # [phone]

## 2024-03-08 ENCOUNTER — Telehealth: Payer: Self-pay | Admitting: "Endocrinology

## 2024-03-08 NOTE — Telephone Encounter (Signed)
 Patient called this afternoon very irate and upset.  She states that she has been in touch with her insurance and [employer] concerning her lab visit on 11/28/2023.  Patient states that the lab procedure codes as well as the diagnosis codes are grossly wrong and is demanding that they be changed.  Patient states that she has talked to some of the staff here and that nothing has been done to change this.  Patient states also that [employer] has told her that they have been in touch with this office to speak with someone about the codes and they have gotten nowhere as well.  Patient is also upset with her visit with Dr. Roosevelt Locks stating that she never listened to what she was telling her about her condition and that Dr. 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  Patient states that she has since changed to a different endocrinology practice.  Patient also states that she would like to speak with the Practice Manager  concerning all of this and woujld like the diagnosis codes and procedure codes changed.

## 2024-03-09 ENCOUNTER — Ambulatory Visit: Payer: BC Managed Care – PPO | Admitting: "Endocrinology

## 2024-03-13 ENCOUNTER — Encounter: Payer: Self-pay | Admitting: "Endocrinology

## 2024-06-25 ENCOUNTER — Telehealth: Payer: Self-pay

## 2024-06-25 NOTE — Telephone Encounter (Signed)
 Received secure chat message from front desk on behalf of administrator that she received a call from [facility] rep concerning codes from December 31, 2023 regarding labs. RN sent email to [facility] reps to see if they are aware of any additional codes needed. Typically a form is sent to office to add these additional codes, however given the date of this RN is unaware if office received a form to add additional codes. Awaiting to hear back from [facility] staff.

## 2024-09-24 ENCOUNTER — Other Ambulatory Visit: Payer: Self-pay | Admitting: Obstetrics and Gynecology

## 2024-09-24 DIAGNOSIS — Z1231 Encounter for screening mammogram for malignant neoplasm of breast: Secondary | ICD-10-CM

## 2024-10-25 ENCOUNTER — Ambulatory Visit
Admission: RE | Admit: 2024-10-25 | Discharge: 2024-10-25 | Disposition: A | Discharge status: Home / Self Care | Source: Ambulatory Visit | Attending: Obstetrics and Gynecology | Admitting: Obstetrics and Gynecology

## 2024-10-25 DIAGNOSIS — Z1231 Encounter for screening mammogram for malignant neoplasm of breast: Secondary | ICD-10-CM
